# Patient Record
Sex: Male | Born: 1975 | Race: Black or African American | Hispanic: No | Marital: Single | Smoking: Never smoker
Health system: Southern US, Community
[De-identification: ages and names within clinical notes are randomized; demographics above are authoritative.]

---

## 2001-03-12 ENCOUNTER — Emergency Department (HOSPITAL_COMMUNITY): Admission: EM | Admit: 2001-03-12 | Discharge: 2001-03-13 | Payer: Self-pay | Admitting: Emergency Medicine

## 2004-11-28 ENCOUNTER — Ambulatory Visit: Payer: Self-pay | Admitting: Gastroenterology

## 2004-11-29 ENCOUNTER — Ambulatory Visit: Payer: Self-pay | Admitting: Gastroenterology

## 2004-11-29 ENCOUNTER — Encounter (INDEPENDENT_AMBULATORY_CARE_PROVIDER_SITE_OTHER): Payer: Self-pay | Admitting: *Deleted

## 2004-12-14 ENCOUNTER — Ambulatory Visit: Payer: Self-pay | Admitting: Gastroenterology

## 2005-01-04 ENCOUNTER — Ambulatory Visit: Payer: Self-pay | Admitting: Gastroenterology

## 2005-01-04 ENCOUNTER — Ambulatory Visit (HOSPITAL_COMMUNITY): Admission: RE | Admit: 2005-01-04 | Discharge: 2005-01-04 | Payer: Self-pay | Admitting: Gastroenterology

## 2006-01-18 ENCOUNTER — Ambulatory Visit: Payer: Self-pay | Admitting: Hematology and Oncology

## 2006-01-21 LAB — HEMOGLOBINOPATHY EVALUATION: Hgb A2 Quant: 2.6 % (ref 2.2–3.2)

## 2007-12-29 DIAGNOSIS — K644 Residual hemorrhoidal skin tags: Secondary | ICD-10-CM | POA: Insufficient documentation

## 2007-12-29 DIAGNOSIS — K2970 Gastritis, unspecified, without bleeding: Secondary | ICD-10-CM

## 2007-12-29 DIAGNOSIS — Z860100 Personal history of colon polyps, unspecified: Secondary | ICD-10-CM | POA: Insufficient documentation

## 2007-12-29 DIAGNOSIS — K449 Diaphragmatic hernia without obstruction or gangrene: Secondary | ICD-10-CM

## 2007-12-29 DIAGNOSIS — K2990 Gastroduodenitis, unspecified, without bleeding: Secondary | ICD-10-CM | POA: Insufficient documentation

## 2007-12-30 ENCOUNTER — Ambulatory Visit: Payer: Self-pay | Admitting: Gastroenterology

## 2007-12-30 DIAGNOSIS — K612 Anorectal abscess: Secondary | ICD-10-CM

## 2007-12-30 DIAGNOSIS — R634 Abnormal weight loss: Secondary | ICD-10-CM

## 2007-12-30 LAB — CONVERTED CEMR LAB
HCV Ab: NEGATIVE
Hepatitis B Surface Ag: NEGATIVE

## 2007-12-31 ENCOUNTER — Ambulatory Visit: Payer: Self-pay | Admitting: Gastroenterology

## 2007-12-31 ENCOUNTER — Telehealth: Payer: Self-pay | Admitting: Gastroenterology

## 2007-12-31 LAB — CONVERTED CEMR LAB
AST: 30 units/L (ref 0–37)
Albumin: 3.9 g/dL (ref 3.5–5.2)
BUN: 12 mg/dL (ref 6–23)
Basophils Absolute: 0 10*3/uL (ref 0.0–0.1)
Basophils Relative: 0.4 % (ref 0.0–1.0)
Calcium: 8.9 mg/dL (ref 8.4–10.5)
Creatinine, Ser: 0.9 mg/dL (ref 0.4–1.5)
Eosinophils Absolute: 0 10*3/uL (ref 0.0–0.7)
Eosinophils Relative: 0.9 % (ref 0.0–5.0)
GFR calc Af Amer: 124 mL/min
GFR calc non Af Amer: 103 mL/min
HCT: 46.6 % (ref 39.0–52.0)
MCHC: 34 g/dL (ref 30.0–36.0)
MCV: 100.8 fL — ABNORMAL HIGH (ref 78.0–100.0)
Monocytes Absolute: 0.5 10*3/uL (ref 0.1–1.0)
Neutro Abs: 3.1 10*3/uL (ref 1.4–7.7)
RBC: 4.62 M/uL (ref 4.22–5.81)
TSH: 0.97 microintl units/mL (ref 0.35–5.50)
WBC: 5 10*3/uL (ref 4.5–10.5)

## 2008-01-01 ENCOUNTER — Encounter: Payer: Self-pay | Admitting: Gastroenterology

## 2008-09-17 ENCOUNTER — Encounter: Admission: RE | Admit: 2008-09-17 | Discharge: 2008-09-17 | Payer: Self-pay | Admitting: Specialist

## 2009-08-16 ENCOUNTER — Emergency Department (HOSPITAL_COMMUNITY): Admission: EM | Admit: 2009-08-16 | Discharge: 2009-08-16 | Payer: Self-pay | Admitting: Emergency Medicine

## 2009-11-17 ENCOUNTER — Encounter (INDEPENDENT_AMBULATORY_CARE_PROVIDER_SITE_OTHER): Payer: Self-pay | Admitting: *Deleted

## 2010-08-01 NOTE — Letter (Signed)
Summary: Colonoscopy Date Change Letter  [facility]  [address] [place], [place] [address]   Phone: [phone]  Fax: [phone]      Nov 17, 2009 MRN: [id number]   James Joyce [address] [place], [place]  [address]   Dear [name],   Previously you were recommended to have a repeat colonoscopy around this time. Your chart was recently reviewed by Dr. Jarold Motto of [facility]. Follow up colonoscopy is now recommended in May 2016. This revised recommendation is based on current, nationally recognized guidelines for colorectal cancer screening and polyp surveillance. These guidelines are endorsed by the American Cancer Society, [employer] on Colorectal Cancer as well as numerous other major medical organizations.  Please understand that our recommendation assumes that you do not have any new symptoms such as bleeding, a change in bowel habits, anemia, or significant abdominal discomfort. If you do have any concerning GI symptoms or want to discuss the guideline recommendations, please call to arrange an office visit at your earliest convenience. Otherwise we will keep you in our reminder system and contact you 1-2 months prior to the date listed above to schedule your next colonoscopy.  Thank you,   Vania Rea. Jarold Motto, M.D.  [facility] [phone]

## 2010-11-17 NOTE — Op Note (Signed)
NAMEJASSIEL, James Joyce               ACCOUNT NO.:  [id number]   MEDICAL RECORD NO.:  [id number]          PATIENT TYPE:  AMB   LOCATION:  ENDO                         FACILITY:  [facility]   PHYSICIAN:  Vania Rea. Jarold Motto, M.D. [facility] OF BIRTH:  07-30-1975   DATE OF PROCEDURE:  01/04/2005  DATE OF DISCHARGE:  01/04/2005                                 OPERATIVE REPORT   PROCEDURE PERFORMED:  Esophageal manometry.   INDICATIONS FOR PROCEDURE:  This patient has chronic chest pain and  gastroesophageal reflux disease.  Manometry was undertaken to be sure that  he did not have an esophageal motility disorder.   RESULTS:  1.  Upper esophageal sphincter.  I really could not evaluate the upper      esophageal sphincter based on manometry data provided at this time.  2.  Lower esophageal sphincter.  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  There appeared to be adequate relaxation      on swallowing.  3.  Motility pattern.  There are normally propagated peristaltic waves      throughout the length of the esophagus.  Mean amplitude of esophageal      contractions 72 mmHg.   ASSESSMENT:  This manometry shows incompetent lower esophageal sphincter  with no evidence of esophageal motility disorder.   24-HOUR PH PROBE TEST:  24-hour pH probe test was completed on January 04, 2005.  Both proximal and distal passive pH probes were placed.  There was no  evidence of distal or proximal acid reflux on this exam with a DeMeester  score of 5.8. There was no reflux in the supine or upright positions.  There  also was no significant positivity to his symptom index.   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  This possibly reflects the patient being on proton  pump inhibitor therapy during the time of this procedure.        DRP/MEDQ  D:   01/11/2005  T:  01/11/2005  Job:  [id number]

## 2012-11-26 ENCOUNTER — Encounter (HOSPITAL_COMMUNITY): Payer: Self-pay | Admitting: Emergency Medicine

## 2012-11-26 DIAGNOSIS — K2990 Gastroduodenitis, unspecified, without bleeding: Secondary | ICD-10-CM | POA: Insufficient documentation

## 2012-11-26 DIAGNOSIS — K2970 Gastritis, unspecified, without bleeding: Secondary | ICD-10-CM | POA: Insufficient documentation

## 2012-11-26 DIAGNOSIS — Z79899 Other long term (current) drug therapy: Secondary | ICD-10-CM | POA: Insufficient documentation

## 2012-11-26 DIAGNOSIS — R112 Nausea with vomiting, unspecified: Secondary | ICD-10-CM | POA: Insufficient documentation

## 2012-11-26 NOTE — ED Notes (Signed)
PT. REPORTS NAUSEA AND VOMITTING WITH POOR APPETITE ONSET LAST WEEK , DENIES FEVER OR CHILLS , NO ABDOMINAL PAIN .

## 2012-11-27 ENCOUNTER — Emergency Department (HOSPITAL_COMMUNITY)
Admission: EM | Admit: 2012-11-27 | Discharge: 2012-11-27 | Disposition: A | Discharge status: Home / Self Care | Payer: Self-pay | Attending: Emergency Medicine | Admitting: Emergency Medicine

## 2012-11-27 DIAGNOSIS — R112 Nausea with vomiting, unspecified: Secondary | ICD-10-CM

## 2012-11-27 DIAGNOSIS — K2970 Gastritis, unspecified, without bleeding: Secondary | ICD-10-CM

## 2012-11-27 LAB — COMPREHENSIVE METABOLIC PANEL
ALT: 26 U/L (ref 0–53)
Alkaline Phosphatase: 60 U/L (ref 39–117)
Chloride: 96 mEq/L (ref 96–112)
GFR calc Af Amer: >90 mL/min (ref >90)
Glucose, Bld: 101 mg/dL — ABNORMAL HIGH (ref 70–99)
Potassium: 4 mEq/L (ref 3.5–5.1)
Sodium: 135 mEq/L (ref 135–145)
Total Protein: 7.3 g/dL (ref 6.0–8.3)

## 2012-11-27 LAB — CBC WITH DIFFERENTIAL/PLATELET
Basophils Absolute: 0.1 10*3/uL (ref 0.0–0.1)
Eosinophils Absolute: 0.1 10*3/uL (ref 0.0–0.7)
HCT: 40.4 % (ref 39.0–52.0)
Lymphocytes Relative: 32 % (ref 12–46)
Lymphs Abs: 1.7 10*3/uL (ref 0.7–4.0)
MCHC: 36.6 g/dL — ABNORMAL HIGH (ref 30.0–36.0)
MCV: 91.2 fL (ref 78.0–100.0)
Neutro Abs: 2.7 10*3/uL (ref 1.7–7.7)
RDW: 11.4 % — ABNORMAL LOW (ref 11.5–15.5)

## 2012-11-27 LAB — URINALYSIS, ROUTINE W REFLEX MICROSCOPIC
Bilirubin Urine: NEGATIVE
Hgb urine dipstick: NEGATIVE
Protein, ur: NEGATIVE mg/dL
Urobilinogen, UA: 0.2 mg/dL (ref 0.0–1.0)

## 2012-11-27 MED ORDER — ONDANSETRON 4 MG PO TBDP: 4.0000 mg | ORAL_TABLET | Freq: Three times a day (TID) | ORAL | Status: DC | PRN

## 2012-11-27 MED ORDER — SODIUM CHLORIDE 0.9 % IV BOLUS (SEPSIS)
1000.0000 mL | Freq: Once | INTRAVENOUS | Status: AC
  Administered 2012-11-27: 1000 mL via INTRAVENOUS

## 2012-11-27 MED ORDER — ONDANSETRON HCL 4 MG/2ML IJ SOLN
INTRAMUSCULAR | Status: AC
  Filled 2012-11-27: qty 2

## 2012-11-27 MED ORDER — PANTOPRAZOLE SODIUM 40 MG IV SOLR
40.0000 mg | Freq: Once | INTRAVENOUS | Status: AC
  Administered 2012-11-27: 40 mg via INTRAVENOUS
  Filled 2012-11-27: qty 40

## 2012-11-27 MED ORDER — ONDANSETRON HCL 4 MG/2ML IJ SOLN
4.0000 mg | Freq: Once | INTRAMUSCULAR | Status: AC
  Administered 2012-11-27: 4 mg via INTRAVENOUS

## 2012-11-27 MED ORDER — PANTOPRAZOLE SODIUM 20 MG PO TBEC: 40.0000 mg | DELAYED_RELEASE_TABLET | Freq: Every day | ORAL | Status: DC

## 2012-11-27 NOTE — ED Provider Notes (Signed)
History     CSN: [id number]  Arrival date & time 11/26/12  2346   First MD Initiated Contact with Patient 11/27/12 0138      Chief Complaint  Patient presents with  . Emesis    (Consider location/radiation/quality/duration/timing/severity/associated sxs/prior treatment) HPI 37 year old male presents to emergency room with complaint of nausea and vomiting, ongoing since Friday.  He has not been able to eat or drink well for last 2 days.  Patient has history of gastritis, and reports it has been sometime since he took his medication.  Patient reports he does drink alcohol on a regular basis, but does not think he had a binge episode before his most recent nausea and vomiting episode.  No prior history of pancreatitis.  No abdominal pain.  No fever or chills.  No sick contacts.  Patient is originally from [place], but [place] the country [place] the last 16 [place].  No diarrhea.  History reviewed. No pertinent past medical history.  History reviewed. No pertinent past surgical history.  No family history on file.  History  Substance Use Topics  . Smoking status: Never Smoker   . Smokeless tobacco: Not on file  . Alcohol Use: Yes      Review of Systems  See History of Present Illness; otherwise all other systems are reviewed and negative Allergies  Review of patient's allergies indicates no known allergies.  Home Medications   Current Outpatient Rx  Name  Route  Sig  Dispense  Refill  . OVER THE COUNTER MEDICATION   Oral   Take 1 tablet by mouth daily. otc acid reflux medication         . ondansetron (ZOFRAN ODT) 4 MG disintegrating tablet   Oral   Take 1 tablet (4 mg total) by mouth every 8 (eight) hours as needed for nausea.   20 tablet   0   . pantoprazole (PROTONIX) 20 MG tablet   Oral   Take 2 tablets (40 mg total) by mouth daily.   30 tablet   0     BP 118/72  Pulse 84  Temp(Src) 99.1 F (37.3 C) (Oral)  Resp 26  SpO2 100%  Physical Exam  Nursing  note and vitals reviewed. Constitutional: He is oriented to person, place, and time. He appears well-developed and well-nourished.  HENT:  Head: Normocephalic and atraumatic.  Right Ear: External ear normal.  Left Ear: External ear normal.  Nose: Nose normal.  Mouth/Throat: Oropharynx is clear and moist.  Eyes: Conjunctivae and EOM are normal. Pupils are equal, round, and reactive to light.  Neck: Normal range of motion. Neck supple. No JVD present. No tracheal deviation present. No thyromegaly present.  Cardiovascular: Normal rate, regular rhythm, normal heart sounds and intact distal pulses.  Exam reveals no gallop and no friction rub.   No murmur heard. Pulmonary/Chest: Effort normal and breath sounds normal. No stridor. No respiratory distress. He has no wheezes. He has no rales. He exhibits no tenderness.  Abdominal: Soft. Bowel sounds are normal. He exhibits no distension and no mass. There is no tenderness. There is no rebound and no guarding.  Musculoskeletal: Normal range of motion. He exhibits no edema and no tenderness.  Lymphadenopathy:    He has no cervical adenopathy.  Neurological: He is alert and oriented to person, place, and time. He exhibits normal muscle tone. Coordination normal.  Skin: Skin is warm and dry. No rash noted. No erythema. No pallor.  Psychiatric: He has a normal mood  and affect. His behavior is normal. Judgment and thought content normal.    ED Course  Procedures (including critical care time)  Labs Reviewed  COMPREHENSIVE METABOLIC PANEL - Abnormal; Notable for the following:    Glucose, Bld 101 (*)    AST 51 (*)    All other components within normal limits  CBC WITH DIFFERENTIAL - Abnormal; Notable for the following:    MCHC 36.6 (*)    RDW 11.4 (*)    Monocytes Relative 15 (*)    All other components within normal limits  URINALYSIS, ROUTINE W REFLEX MICROSCOPIC - Abnormal; Notable for the following:    Ketones, ur 40 (*)    All other  components within normal limits  LIPASE, BLOOD   No results found.   1. Nausea and vomiting   2. Gastritis       MDM  37 year old male with nausea and vomiting and probable return of his gastritis.  Labs with slightly elevated.  AST.  Ketones noted in urine.  Patient is feeling better after IV fluids.  No further vomiting.  Will discharge home with Protonix and Zofran.        Olivia Mackie, MD 11/27/12 [phone]

## 2013-10-20 ENCOUNTER — Emergency Department (HOSPITAL_COMMUNITY)
Admission: EM | Admit: 2013-10-20 | Discharge: 2013-10-20 | Disposition: A | Discharge status: Home / Self Care | Payer: No Typology Code available for payment source | Attending: Emergency Medicine | Admitting: Emergency Medicine

## 2013-10-20 ENCOUNTER — Encounter (HOSPITAL_COMMUNITY): Payer: Self-pay | Admitting: Emergency Medicine

## 2013-10-20 DIAGNOSIS — Y9389 Activity, other specified: Secondary | ICD-10-CM | POA: Insufficient documentation

## 2013-10-20 DIAGNOSIS — Y92410 Unspecified street and highway as the place of occurrence of the external cause: Secondary | ICD-10-CM | POA: Insufficient documentation

## 2013-10-20 DIAGNOSIS — Z79899 Other long term (current) drug therapy: Secondary | ICD-10-CM | POA: Insufficient documentation

## 2013-10-20 DIAGNOSIS — S336XXA Sprain of sacroiliac joint, initial encounter: Secondary | ICD-10-CM | POA: Insufficient documentation

## 2013-10-20 MED ORDER — CYCLOBENZAPRINE HCL 10 MG PO TABS: 10.0000 mg | ORAL_TABLET | Freq: Two times a day (BID) | ORAL | Status: DC | PRN

## 2013-10-20 MED ORDER — IBUPROFEN 800 MG PO TABS: 800.0000 mg | ORAL_TABLET | Freq: Three times a day (TID) | ORAL | Status: DC

## 2013-10-20 NOTE — Discharge Instructions (Signed)
Take ibuprofen for pain. Take Flexeril as needed for muscle spasm. Refer to attached documents for more information.
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
## 2013-10-21 NOTE — ED Provider Notes (Signed)
Medical screening examination/treatment/procedure(s) were performed by non-physician practitioner and as supervising physician I was immediately available for consultation/collaboration.   EKG Interpretation None        Trayquan Kolakowski N Rowyn Spilde, DO 10/21/13 0913 

## 2014-11-22 ENCOUNTER — Encounter: Payer: Self-pay | Admitting: Gastroenterology

## 2019-01-13 ENCOUNTER — Other Ambulatory Visit: Payer: Self-pay

## 2019-01-13 ENCOUNTER — Emergency Department (HOSPITAL_COMMUNITY): Payer: Self-pay

## 2019-01-13 ENCOUNTER — Inpatient Hospital Stay (HOSPITAL_COMMUNITY)
Admission: EM | Admit: 2019-01-13 | Discharge: 2019-01-15 | DRG: 897 | Disposition: A | Discharge status: Home Health | Payer: Self-pay | Attending: Internal Medicine | Admitting: Internal Medicine

## 2019-01-13 ENCOUNTER — Encounter (HOSPITAL_COMMUNITY): Payer: Self-pay | Admitting: Emergency Medicine

## 2019-01-13 DIAGNOSIS — M25561 Pain in right knee: Secondary | ICD-10-CM | POA: Diagnosis present

## 2019-01-13 DIAGNOSIS — R402362 Coma scale, best motor response, obeys commands, at arrival to emergency department: Secondary | ICD-10-CM | POA: Diagnosis present

## 2019-01-13 DIAGNOSIS — M25569 Pain in unspecified knee: Secondary | ICD-10-CM

## 2019-01-13 DIAGNOSIS — F10239 Alcohol dependence with withdrawal, unspecified: Principal | ICD-10-CM | POA: Diagnosis present

## 2019-01-13 DIAGNOSIS — R3129 Other microscopic hematuria: Secondary | ICD-10-CM | POA: Diagnosis present

## 2019-01-13 DIAGNOSIS — S60512A Abrasion of left hand, initial encounter: Secondary | ICD-10-CM | POA: Diagnosis present

## 2019-01-13 DIAGNOSIS — S025XXB Fracture of tooth (traumatic), initial encounter for open fracture: Secondary | ICD-10-CM

## 2019-01-13 DIAGNOSIS — F1020 Alcohol dependence, uncomplicated: Secondary | ICD-10-CM | POA: Diagnosis present

## 2019-01-13 DIAGNOSIS — K7010 Alcoholic hepatitis without ascites: Secondary | ICD-10-CM | POA: Diagnosis present

## 2019-01-13 DIAGNOSIS — E861 Hypovolemia: Secondary | ICD-10-CM | POA: Diagnosis present

## 2019-01-13 DIAGNOSIS — Y900 Blood alcohol level of less than 20 mg/100 ml: Secondary | ICD-10-CM | POA: Diagnosis present

## 2019-01-13 DIAGNOSIS — R569 Unspecified convulsions: Secondary | ICD-10-CM

## 2019-01-13 DIAGNOSIS — Y92481 Parking lot as the place of occurrence of the external cause: Secondary | ICD-10-CM

## 2019-01-13 DIAGNOSIS — M25551 Pain in right hip: Secondary | ICD-10-CM | POA: Diagnosis present

## 2019-01-13 DIAGNOSIS — G4089 Other seizures: Secondary | ICD-10-CM | POA: Diagnosis present

## 2019-01-13 DIAGNOSIS — S0081XA Abrasion of other part of head, initial encounter: Secondary | ICD-10-CM | POA: Diagnosis present

## 2019-01-13 DIAGNOSIS — R402142 Coma scale, eyes open, spontaneous, at arrival to emergency department: Secondary | ICD-10-CM | POA: Diagnosis present

## 2019-01-13 DIAGNOSIS — R9431 Abnormal electrocardiogram [ECG] [EKG]: Secondary | ICD-10-CM | POA: Diagnosis present

## 2019-01-13 DIAGNOSIS — E876 Hypokalemia: Secondary | ICD-10-CM | POA: Diagnosis present

## 2019-01-13 DIAGNOSIS — Z20828 Contact with and (suspected) exposure to other viral communicable diseases: Secondary | ICD-10-CM

## 2019-01-13 DIAGNOSIS — R402252 Coma scale, best verbal response, oriented, at arrival to emergency department: Secondary | ICD-10-CM | POA: Diagnosis present

## 2019-01-13 DIAGNOSIS — Z79899 Other long term (current) drug therapy: Secondary | ICD-10-CM

## 2019-01-13 DIAGNOSIS — R7401 Elevation of levels of liver transaminase levels: Secondary | ICD-10-CM | POA: Diagnosis present

## 2019-01-13 DIAGNOSIS — Z791 Long term (current) use of non-steroidal anti-inflammatories (NSAID): Secondary | ICD-10-CM

## 2019-01-13 DIAGNOSIS — F10939 Alcohol use, unspecified with withdrawal, unspecified: Secondary | ICD-10-CM | POA: Diagnosis present

## 2019-01-13 DIAGNOSIS — D696 Thrombocytopenia, unspecified: Secondary | ICD-10-CM | POA: Diagnosis present

## 2019-01-13 DIAGNOSIS — S025XXA Fracture of tooth (traumatic), initial encounter for closed fracture: Secondary | ICD-10-CM | POA: Diagnosis present

## 2019-01-13 DIAGNOSIS — W19XXXA Unspecified fall, initial encounter: Secondary | ICD-10-CM | POA: Diagnosis present

## 2019-01-13 DIAGNOSIS — E871 Hypo-osmolality and hyponatremia: Secondary | ICD-10-CM | POA: Diagnosis present

## 2019-01-13 DIAGNOSIS — Y939 Activity, unspecified: Secondary | ICD-10-CM

## 2019-01-13 LAB — URINALYSIS, ROUTINE W REFLEX MICROSCOPIC
Bilirubin Urine: NEGATIVE
Glucose, UA: NEGATIVE mg/dL
Ketones, ur: NEGATIVE mg/dL
Leukocytes,Ua: NEGATIVE
Nitrite: NEGATIVE
Protein, ur: NEGATIVE mg/dL
Specific Gravity, Urine: 1.003 — ABNORMAL LOW (ref 1.005–1.030)
pH: 6 (ref 5.0–8.0)

## 2019-01-13 LAB — CBC WITH DIFFERENTIAL/PLATELET
Abs Immature Granulocytes: 0.02 10*3/uL (ref 0.00–0.07)
Basophils Absolute: 0 10*3/uL (ref 0.0–0.1)
Basophils Relative: 1 %
Eosinophils Absolute: 0.1 10*3/uL (ref 0.0–0.5)
Eosinophils Relative: 1 %
HCT: 40.6 % (ref 39.0–52.0)
Hemoglobin: 14.1 g/dL (ref 13.0–17.0)
Immature Granulocytes: 1 %
Lymphocytes Relative: 19 %
Lymphs Abs: 0.9 10*3/uL (ref 0.7–4.0)
MCH: 34.7 pg — ABNORMAL HIGH (ref 26.0–34.0)
MCHC: 34.7 g/dL (ref 30.0–36.0)
MCV: 100 fL (ref 80.0–100.0)
Monocytes Absolute: 0.5 10*3/uL (ref 0.1–1.0)
Monocytes Relative: 11 %
Neutro Abs: 3 10*3/uL (ref 1.7–7.7)
Neutrophils Relative %: 67 %
Platelets: 85 10*3/uL — ABNORMAL LOW (ref 150–400)
RBC: 4.06 MIL/uL — ABNORMAL LOW (ref 4.22–5.81)
RDW: 12.6 % (ref 11.5–15.5)
WBC: 4.4 10*3/uL (ref 4.0–10.5)
nRBC: 0 % (ref 0.0–0.2)

## 2019-01-13 LAB — RAPID URINE DRUG SCREEN, HOSP PERFORMED
Amphetamines: NOT DETECTED
Barbiturates: NOT DETECTED
Benzodiazepines: POSITIVE — AB
Cocaine: NOT DETECTED
Opiates: NOT DETECTED
Tetrahydrocannabinol: NOT DETECTED

## 2019-01-13 LAB — COMPREHENSIVE METABOLIC PANEL
ALT: 570 U/L — ABNORMAL HIGH (ref 0–44)
AST: 1511 U/L — ABNORMAL HIGH (ref 15–41)
Albumin: 3.6 g/dL (ref 3.5–5.0)
Alkaline Phosphatase: 241 U/L — ABNORMAL HIGH (ref 38–126)
Anion gap: 16 — ABNORMAL HIGH (ref 5–15)
BUN: <5 mg/dL — ABNORMAL LOW (ref 6–20)
CO2: 22 mmol/L (ref 22–32)
Calcium: 9.5 mg/dL (ref 8.9–10.3)
Chloride: 93 mmol/L — ABNORMAL LOW (ref 98–111)
Creatinine, Ser: 0.9 mg/dL (ref 0.61–1.24)
GFR calc Af Amer: >60 mL/min (ref >60)
GFR calc non Af Amer: >60 mL/min (ref >60)
Glucose, Bld: 112 mg/dL — ABNORMAL HIGH (ref 70–99)
Potassium: 4 mmol/L (ref 3.5–5.1)
Sodium: 131 mmol/L — ABNORMAL LOW (ref 135–145)
Total Bilirubin: 0.6 mg/dL (ref 0.3–1.2)
Total Protein: 7.3 g/dL (ref 6.5–8.1)

## 2019-01-13 LAB — CBG MONITORING, ED: Glucose-Capillary: 113 mg/dL — ABNORMAL HIGH (ref 70–99)

## 2019-01-13 LAB — ETHANOL: Alcohol, Ethyl (B): <10 mg/dL (ref <10)

## 2019-01-13 MED ORDER — THIAMINE HCL 100 MG/ML IJ SOLN
100.0000 mg | Freq: Every day | INTRAMUSCULAR | Status: DC
  Administered 2019-01-13: 100 mg via INTRAVENOUS
  Filled 2019-01-13: qty 2

## 2019-01-13 MED ORDER — SODIUM CHLORIDE 0.9 % IV BOLUS
1000.0000 mL | Freq: Once | INTRAVENOUS | Status: AC
  Administered 2019-01-13: 1000 mL via INTRAVENOUS

## 2019-01-13 MED ORDER — LORAZEPAM 1 MG PO TABS: 0.0000 mg | ORAL_TABLET | Freq: Four times a day (QID) | ORAL | Status: DC

## 2019-01-13 MED ORDER — VITAMIN B-1 100 MG PO TABS: 100.0000 mg | ORAL_TABLET | Freq: Every day | ORAL | Status: DC

## 2019-01-13 MED ORDER — LORAZEPAM 2 MG/ML IJ SOLN: 0.0000 mg | Freq: Two times a day (BID) | INTRAMUSCULAR | Status: DC

## 2019-01-13 MED ORDER — PENICILLIN V POTASSIUM 500 MG PO TABS: 500.0000 mg | ORAL_TABLET | Freq: Four times a day (QID) | ORAL | 0 refills | Status: DC

## 2019-01-13 MED ORDER — LORAZEPAM 2 MG/ML IJ SOLN
0.0000 mg | Freq: Four times a day (QID) | INTRAMUSCULAR | Status: DC
  Administered 2019-01-13: 2 mg via INTRAVENOUS
  Filled 2019-01-13: qty 1

## 2019-01-13 MED ORDER — LORAZEPAM 1 MG PO TABS: 0.0000 mg | ORAL_TABLET | Freq: Two times a day (BID) | ORAL | Status: DC

## 2019-01-13 NOTE — ED Notes (Signed)
Please call patients cousin (only family in the [place]) Threasa Heads at [phone] to update her on patients condition.

## 2019-01-13 NOTE — ED Provider Notes (Signed)
[facility] Provider Note   CSN: [id number] Arrival date & time: 01/13/19  2029    History   Chief Complaint Chief Complaint  Patient presents with  . Seizures  . Alcohol Intoxication    HPI James Joyce is a 43 y.o. male.     43 year old male brought in by EMS for possible seizure.  Per EMS, patient was found lying in a parking lot with his body shaking.  Patient has abrasions to the right side of his face with a broken right upper central incisor as well as a small abrasion to his left hand.  Patient states [place], does not know what happened to him today, no prior history of seizures.  Patient admits to drinking on average 440 ounce beers daily, states he has had two today.  Patient reports feeling well, denies any injuries, complaints, concerns today.     History reviewed. No pertinent past medical history.  Patient Active Problem List   Diagnosis Date Noted  . ABSCESS OF ANAL AND RECTAL REGIONS 12/30/2007  . WEIGHT LOSS 12/30/2007  . EXTERNAL HEMORRHOIDS 12/29/2007  . GASTRITIS 12/29/2007  . HIATAL HERNIA 12/29/2007  . COLONIC POLYPS, HYPERPLASTIC, HX OF 12/29/2007    History reviewed. No pertinent surgical history.      Home Medications    Prior to Admission medications   Medication Sig Start Date End Date Taking? Authorizing Provider  cyclobenzaprine (FLEXERIL) 10 MG tablet Take 1 tablet (10 mg total) by mouth 2 (two) times daily as needed for muscle spasms. 10/20/13   Emilia BeckSzekalski, Kaitlyn, PA-C  ibuprofen (ADVIL,MOTRIN) 800 MG tablet Take 1 tablet (800 mg total) by mouth 3 (three) times daily. 10/20/13   Emilia BeckSzekalski, Kaitlyn, PA-C  Lansoprazole (PREVACID PO) Take 1 capsule by mouth daily.    [provider]  penicillin v potassium (VEETID) 500 MG tablet Take 1 tablet (500 mg total) by mouth 4 (four) times daily for 7 days. 01/13/19 01/20/19  Jeannie FendMurphy, Clarivel Callaway A, PA-C    Family History  History reviewed. No pertinent family history.  Social History Social History   Tobacco Use  . Smoking status: Never Smoker  . Smokeless tobacco: Never Used  Substance Use Topics  . Alcohol use: Yes    Comment: 64oz beers x4  . Drug use: No     Allergies   Patient has no known allergies.   Review of Systems Review of Systems  Unable to perform ROS: Mental status change  Respiratory: Negative for shortness of breath.   Cardiovascular: Negative for chest pain.  Gastrointestinal: Negative for abdominal pain, constipation, diarrhea, nausea and vomiting.  Musculoskeletal: Negative for arthralgias, back pain, joint swelling, myalgias, neck pain and neck stiffness.  Neurological: Negative for dizziness, weakness and headaches.     Physical Exam Updated Vital Signs BP 122/79   Pulse 85   Temp 98.7 F (37.1 C) (Oral)   Resp (!) 21   SpO2 100%   Physical Exam Vitals signs and nursing note reviewed.  Constitutional:      General: He is not in acute distress.    Appearance: He is well-developed. He is not diaphoretic.  HENT:     Head: Normocephalic.      Nose: Nose normal.     Mouth/Throat:     Mouth: Mucous membranes are moist. Injury present.   Cardiovascular:     Rate and Rhythm: Normal rate and regular rhythm.     Pulses: Normal  pulses.     Heart sounds: Normal heart sounds.  Pulmonary:     Effort: Pulmonary effort is normal.     Breath sounds: Normal breath sounds.  Abdominal:     Palpations: Abdomen is soft.     Tenderness: There is no abdominal tenderness.  Musculoskeletal:        General: No tenderness or signs of injury.     Cervical back: He exhibits no tenderness and no bony tenderness.     Thoracic back: He exhibits no tenderness and no bony tenderness.     Lumbar back: He exhibits no tenderness and no bony tenderness.     Comments: Moves all extremities without difficulty or sign of injury.   Skin:    General: Skin is warm and dry.     Findings:  No erythema or rash.  Neurological:     General: No focal deficit present.     Mental Status: He is alert.     GCS: GCS eye subscore is 4. GCS verbal subscore is 5. GCS motor subscore is 6.     Cranial Nerves: No cranial nerve deficit.     Sensory: No sensory deficit.     Motor: Tremor present. No weakness or pronator drift.     Comments: Alert to person, date, states currently in the [facility]  Tremor noted to upper and lower extremities, states this is from alcohol.  Psychiatric:        Behavior: Behavior normal.      ED Treatments / Results  Labs (all labs ordered are listed, but only abnormal results are displayed) Labs Reviewed  COMPREHENSIVE METABOLIC PANEL - Abnormal; Notable for the following components:      Result Value   Sodium 131 (*)    Chloride 93 (*)    Glucose, Bld 112 (*)    BUN <5 (*)    AST 1,511 (*)    ALT 570 (*)    Alkaline Phosphatase 241 (*)    Anion gap 16 (*)    All other components within normal limits  CBC WITH DIFFERENTIAL/PLATELET - Abnormal; Notable for the following components:   RBC 4.06 (*)    MCH 34.7 (*)    Platelets 85 (*)    All other components within normal limits  URINALYSIS, ROUTINE W REFLEX MICROSCOPIC - Abnormal; Notable for the following components:   Specific Gravity, Urine 1.003 (*)    Hgb urine dipstick SMALL (*)    Bacteria, UA RARE (*)    All other components within normal limits  RAPID URINE DRUG SCREEN, HOSP PERFORMED - Abnormal; Notable for the following components:   Benzodiazepines POSITIVE (*)    All other components within normal limits  CBG MONITORING, ED - Abnormal; Notable for the following components:   Glucose-Capillary 113 (*)    All other components within normal limits  ETHANOL  HEPATITIS PANEL, ACUTE    EKG EKG Interpretation  Date/Time:  Tuesday January 13 2019 20:42:35 EDT Ventricular Rate:  101 PR Interval:    QRS Duration: 102 QT Interval:  388 QTC Calculation: 503 R Axis:   -170 Text  Interpretation:  Sinus tachycardia Right axis deviation ST elev, probable normal early repol pattern Prolonged QT interval Abnormal ECG Confirmed by Carmin Muskrat [phone]) on 01/13/2019 9:24:51 PM   Radiology Ct Head Wo Contrast  Result Date: 01/13/2019 CLINICAL DATA:  Seizure, fall. EXAM: CT HEAD WITHOUT CONTRAST CT MAXILLOFACIAL WITHOUT CONTRAST CT CERVICAL SPINE WITHOUT CONTRAST TECHNIQUE: Multidetector CT imaging of the head,  cervical spine, and maxillofacial structures were performed using the standard protocol without intravenous contrast. Multiplanar CT image reconstructions of the cervical spine and maxillofacial structures were also generated. COMPARISON:  None. FINDINGS: CT HEAD FINDINGS Brain: No acute intracranial abnormality. Specifically, no hemorrhage, hydrocephalus, mass lesion, acute infarction, or significant intracranial injury. Vascular: No hyperdense vessel or unexpected calcification. Skull: No acute calvarial abnormality. Other: None CT MAXILLOFACIAL FINDINGS Osseous: No fracture or mandibular dislocation. No destructive process. Orbits: Negative. No traumatic or inflammatory finding. Sinuses: Clear Soft tissues: Soft tissue swelling over the right lateral orbit and face. CT CERVICAL SPINE FINDINGS Alignment: No subluxation Skull base and vertebrae: No acute fracture. No primary bone lesion or focal pathologic process. Soft tissues and spinal canal: No prevertebral fluid or swelling. No visible canal hematoma. Disc levels:  Maintained.  Degenerative facet disease bilaterally. Upper chest: No acute findings Other: None IMPRESSION: No acute intracranial abnormality. No facial or cervical spine fracture. Electronically Signed   By: Charlett NoseKevin  Dover M.D.   On: 01/13/2019 22:30   Ct Cervical Spine Wo Contrast  Result Date: 01/13/2019 CLINICAL DATA:  Seizure, fall. EXAM: CT HEAD WITHOUT CONTRAST CT MAXILLOFACIAL WITHOUT CONTRAST CT CERVICAL SPINE WITHOUT CONTRAST TECHNIQUE: Multidetector CT  imaging of the head, cervical spine, and maxillofacial structures were performed using the standard protocol without intravenous contrast. Multiplanar CT image reconstructions of the cervical spine and maxillofacial structures were also generated. COMPARISON:  None. FINDINGS: CT HEAD FINDINGS Brain: No acute intracranial abnormality. Specifically, no hemorrhage, hydrocephalus, mass lesion, acute infarction, or significant intracranial injury. Vascular: No hyperdense vessel or unexpected calcification. Skull: No acute calvarial abnormality. Other: None CT MAXILLOFACIAL FINDINGS Osseous: No fracture or mandibular dislocation. No destructive process. Orbits: Negative. No traumatic or inflammatory finding. Sinuses: Clear Soft tissues: Soft tissue swelling over the right lateral orbit and face. CT CERVICAL SPINE FINDINGS Alignment: No subluxation Skull base and vertebrae: No acute fracture. No primary bone lesion or focal pathologic process. Soft tissues and spinal canal: No prevertebral fluid or swelling. No visible canal hematoma. Disc levels:  Maintained.  Degenerative facet disease bilaterally. Upper chest: No acute findings Other: None IMPRESSION: No acute intracranial abnormality. No facial or cervical spine fracture. Electronically Signed   By: Charlett NoseKevin  Dover M.D.   On: 01/13/2019 22:30   Ct Maxillofacial Wo Cm  Result Date: 01/13/2019 CLINICAL DATA:  Seizure, fall. EXAM: CT HEAD WITHOUT CONTRAST CT MAXILLOFACIAL WITHOUT CONTRAST CT CERVICAL SPINE WITHOUT CONTRAST TECHNIQUE: Multidetector CT imaging of the head, cervical spine, and maxillofacial structures were performed using the standard protocol without intravenous contrast. Multiplanar CT image reconstructions of the cervical spine and maxillofacial structures were also generated. COMPARISON:  None. FINDINGS: CT HEAD FINDINGS Brain: No acute intracranial abnormality. Specifically, no hemorrhage, hydrocephalus, mass lesion, acute infarction, or significant  intracranial injury. Vascular: No hyperdense vessel or unexpected calcification. Skull: No acute calvarial abnormality. Other: None CT MAXILLOFACIAL FINDINGS Osseous: No fracture or mandibular dislocation. No destructive process. Orbits: Negative. No traumatic or inflammatory finding. Sinuses: Clear Soft tissues: Soft tissue swelling over the right lateral orbit and face. CT CERVICAL SPINE FINDINGS Alignment: No subluxation Skull base and vertebrae: No acute fracture. No primary bone lesion or focal pathologic process. Soft tissues and spinal canal: No prevertebral fluid or swelling. No visible canal hematoma. Disc levels:  Maintained.  Degenerative facet disease bilaterally. Upper chest: No acute findings Other: None IMPRESSION: No acute intracranial abnormality. No facial or cervical spine fracture. Electronically Signed   By: Charlett NoseKevin  Dover M.D.  On: 01/13/2019 22:30    Procedures Procedures (including critical care time)  Medications Ordered in ED Medications  LORazepam (ATIVAN) injection 0-4 mg (2 mg Intravenous Given 01/13/19 2116)    Or  LORazepam (ATIVAN) tablet 0-4 mg ( Oral See Alternative 01/13/19 2116)  LORazepam (ATIVAN) injection 0-4 mg (has no administration in time range)    Or  LORazepam (ATIVAN) tablet 0-4 mg (has no administration in time range)  thiamine (VITAMIN B-1) tablet 100 mg ( Oral See Alternative 01/13/19 2115)    Or  thiamine (B-1) injection 100 mg (100 mg Intravenous Given 01/13/19 2115)  sodium chloride 0.9 % bolus 1,000 mL (1,000 mLs Intravenous New Bag/Given 01/13/19 2141)     Initial Impression / Assessment and Plan / ED Course  I have reviewed the triage vital signs and the nursing notes.  Pertinent labs & imaging results that were available during my care of the patient were reviewed by me and considered in my medical decision making (see chart for details).  Clinical Course as of Jan 13 2352  Tue Jan 13, 2019  2338 43yo male brought in by EMS for possible  seizure. Patient was found on the ground shaking tonight. No history of prior seizures, reports drinking 4, 40oz beers daily, has had 2 40s today. States he came here today on a bus, alert to person and date, for location states [place]. Patient has a broken upper central incisor, minor abrasions to the right cheek and dorsum of left hand. Tremor noted to upper and lower extremities which he states is normal and related to alcohol.  CT head/c-spine/maxillofacial negative for traumatic injuries.  UDS positive for benzos, not on patient's med list, unsure if given by EMS. UA with small hemoglobin, CBC with low platelets at 85k, CMP with elevated liver enzymes most likely related to alcohol use. Acute hepatitis panel added for follow up, US RUQ to evaluate liver/gallbladder.    [LM]  2352 Care signed out pending US.    [LM]    Clinical Course User Index [LM] Jeannie FendMurphy, Paizlee Kinder A, PA-C      Final Clinical Impressions(s) / ED Diagnoses   Final diagnoses:  Seizure-like activity [facility](HCC)  Open fracture of tooth, initial encounter    ED Discharge Orders         Ordered    penicillin v potassium (VEETID) 500 MG tablet  4 times daily     01/13/19 2332           Jeannie FendMurphy, Minard Millirons A, PA-C 01/13/19 2353    Gerhard MunchLockwood, Robert, MD 01/19/19 1919

## 2019-01-13 NOTE — ED Triage Notes (Signed)
Patient from a parking and was laying on ground rolling around.  Patient did have some altered level of consciousness upon EMS arrival.  Lorn Junes thought he was having a seizure.  Phone call from cousin states that he is a heavy drinker.  Patient has been slightly uncooperative.  No complaints at this time, except had some left leg pain previously.  Patient may have chipped a front tooth.  Patient just returned from [facility] yesterday.

## 2019-01-13 NOTE — ED Notes (Signed)
Patient transported to CT 

## 2019-01-13 NOTE — Discharge Instructions (Addendum)
Take Penicillin as prescribed and complete the full course. Do not drive until cleared by Neurology. Follow up with primary care provider for elevated liver enzymes. Follow up with Dentist for your tooth.

## 2019-01-14 ENCOUNTER — Encounter (HOSPITAL_COMMUNITY): Payer: Self-pay | Admitting: Family Medicine

## 2019-01-14 ENCOUNTER — Emergency Department (HOSPITAL_COMMUNITY): Payer: Self-pay

## 2019-01-14 ENCOUNTER — Other Ambulatory Visit: Payer: Self-pay

## 2019-01-14 DIAGNOSIS — R569 Unspecified convulsions: Secondary | ICD-10-CM

## 2019-01-14 DIAGNOSIS — F10239 Alcohol dependence with withdrawal, unspecified: Secondary | ICD-10-CM | POA: Diagnosis present

## 2019-01-14 DIAGNOSIS — F10939 Alcohol use, unspecified with withdrawal, unspecified: Secondary | ICD-10-CM | POA: Diagnosis present

## 2019-01-14 DIAGNOSIS — E871 Hypo-osmolality and hyponatremia: Secondary | ICD-10-CM | POA: Diagnosis present

## 2019-01-14 DIAGNOSIS — R7401 Elevation of levels of liver transaminase levels: Secondary | ICD-10-CM | POA: Diagnosis present

## 2019-01-14 DIAGNOSIS — D696 Thrombocytopenia, unspecified: Secondary | ICD-10-CM | POA: Diagnosis present

## 2019-01-14 DIAGNOSIS — F10232 Alcohol dependence with withdrawal with perceptual disturbance: Secondary | ICD-10-CM

## 2019-01-14 DIAGNOSIS — F1020 Alcohol dependence, uncomplicated: Secondary | ICD-10-CM | POA: Diagnosis present

## 2019-01-14 DIAGNOSIS — R9431 Abnormal electrocardiogram [ECG] [EKG]: Secondary | ICD-10-CM | POA: Diagnosis present

## 2019-01-14 LAB — CBC WITH DIFFERENTIAL/PLATELET
Abs Immature Granulocytes: 0.02 10*3/uL (ref 0.00–0.07)
Basophils Absolute: 0 10*3/uL (ref 0.0–0.1)
Basophils Relative: 1 %
Eosinophils Absolute: 0.1 10*3/uL (ref 0.0–0.5)
Eosinophils Relative: 1 %
HCT: 37.3 % — ABNORMAL LOW (ref 39.0–52.0)
Hemoglobin: 12.9 g/dL — ABNORMAL LOW (ref 13.0–17.0)
Immature Granulocytes: 1 %
Lymphocytes Relative: 11 %
Lymphs Abs: 0.5 10*3/uL — ABNORMAL LOW (ref 0.7–4.0)
MCH: 34.3 pg — ABNORMAL HIGH (ref 26.0–34.0)
MCHC: 34.6 g/dL (ref 30.0–36.0)
MCV: 99.2 fL (ref 80.0–100.0)
Monocytes Absolute: 0.5 10*3/uL (ref 0.1–1.0)
Monocytes Relative: 13 %
Neutro Abs: 2.9 10*3/uL (ref 1.7–7.7)
Neutrophils Relative %: 73 %
Platelets: 80 10*3/uL — ABNORMAL LOW (ref 150–400)
RBC: 3.76 MIL/uL — ABNORMAL LOW (ref 4.22–5.81)
RDW: 12.5 % (ref 11.5–15.5)
WBC: 4 10*3/uL (ref 4.0–10.5)
nRBC: 0 % (ref 0.0–0.2)

## 2019-01-14 LAB — HIV ANTIBODY (ROUTINE TESTING W REFLEX): HIV Screen 4th Generation wRfx: NONREACTIVE

## 2019-01-14 LAB — COMPREHENSIVE METABOLIC PANEL
ALT: 392 U/L — ABNORMAL HIGH (ref 0–44)
AST: 759 U/L — ABNORMAL HIGH (ref 15–41)
Albumin: 3 g/dL — ABNORMAL LOW (ref 3.5–5.0)
Alkaline Phosphatase: 201 U/L — ABNORMAL HIGH (ref 38–126)
Anion gap: 10 (ref 5–15)
BUN: <5 mg/dL — ABNORMAL LOW (ref 6–20)
CO2: 25 mmol/L (ref 22–32)
Calcium: 8.8 mg/dL — ABNORMAL LOW (ref 8.9–10.3)
Chloride: 101 mmol/L (ref 98–111)
Creatinine, Ser: 0.68 mg/dL (ref 0.61–1.24)
GFR calc Af Amer: >60 mL/min (ref >60)
GFR calc non Af Amer: >60 mL/min (ref >60)
Glucose, Bld: 95 mg/dL (ref 70–99)
Potassium: 3.3 mmol/L — ABNORMAL LOW (ref 3.5–5.1)
Sodium: 136 mmol/L (ref 135–145)
Total Bilirubin: 1 mg/dL (ref 0.3–1.2)
Total Protein: 6.2 g/dL — ABNORMAL LOW (ref 6.5–8.1)

## 2019-01-14 LAB — PROTIME-INR
INR: 1 (ref 0.8–1.2)
Prothrombin Time: 13.3 seconds (ref 11.4–15.2)

## 2019-01-14 LAB — MAGNESIUM: Magnesium: 2.2 mg/dL (ref 1.7–2.4)

## 2019-01-14 LAB — SARS CORONAVIRUS 2 BY RT PCR (HOSPITAL ORDER, PERFORMED IN ~~LOC~~ HOSPITAL LAB): SARS Coronavirus 2: NEGATIVE

## 2019-01-14 LAB — AMMONIA: Ammonia: 58 umol/L — ABNORMAL HIGH (ref 9–35)

## 2019-01-14 MED ORDER — LORAZEPAM 2 MG/ML IJ SOLN
2.0000 mg | INTRAMUSCULAR | Status: DC | PRN
  Administered 2019-01-14: 2 mg via INTRAVENOUS
  Filled 2019-01-14 (×2): qty 1

## 2019-01-14 MED ORDER — ACETAMINOPHEN 325 MG PO TABS: 650.0000 mg | ORAL_TABLET | Freq: Four times a day (QID) | ORAL | Status: DC | PRN

## 2019-01-14 MED ORDER — PANTOPRAZOLE SODIUM 20 MG PO TBEC
20.0000 mg | DELAYED_RELEASE_TABLET | Freq: Every day | ORAL | Status: DC
  Administered 2019-01-14 – 2019-01-15 (×2): 20 mg via ORAL
  Filled 2019-01-14 (×2): qty 1

## 2019-01-14 MED ORDER — DICLOFENAC SODIUM 1 % TD GEL
2.0000 g | Freq: Four times a day (QID) | TRANSDERMAL | Status: DC
  Administered 2019-01-14 – 2019-01-15 (×2): 2 g via TOPICAL
  Filled 2019-01-14: qty 100

## 2019-01-14 MED ORDER — POTASSIUM CHLORIDE CRYS ER 20 MEQ PO TBCR
40.0000 meq | EXTENDED_RELEASE_TABLET | ORAL | Status: AC
  Administered 2019-01-14 (×2): 40 meq via ORAL
  Filled 2019-01-14 (×2): qty 2

## 2019-01-14 MED ORDER — ONDANSETRON HCL 4 MG/2ML IJ SOLN: 4.0000 mg | Freq: Four times a day (QID) | INTRAMUSCULAR | Status: DC | PRN

## 2019-01-14 MED ORDER — BENZOCAINE 10 % MT GEL
Freq: Four times a day (QID) | OROMUCOSAL | Status: DC | PRN
  Administered 2019-01-14: 21:00:00 via OROMUCOSAL
  Filled 2019-01-14: qty 9

## 2019-01-14 MED ORDER — CYCLOBENZAPRINE HCL 10 MG PO TABS
5.0000 mg | ORAL_TABLET | Freq: Once | ORAL | Status: AC
  Administered 2019-01-14: 21:00:00 5 mg via ORAL
  Filled 2019-01-14: qty 1

## 2019-01-14 MED ORDER — THIAMINE HCL 100 MG/ML IJ SOLN
Freq: Once | INTRAVENOUS | Status: AC
  Administered 2019-01-14: 03:00:00 via INTRAVENOUS
  Filled 2019-01-14: qty 1000

## 2019-01-14 MED ORDER — DEXTROSE IN LACTATED RINGERS 5 % IV SOLN
INTRAVENOUS | Status: DC
  Administered 2019-01-14 – 2019-01-15 (×2): via INTRAVENOUS

## 2019-01-14 MED ORDER — SODIUM CHLORIDE 0.9% FLUSH
3.0000 mL | Freq: Two times a day (BID) | INTRAVENOUS | Status: DC
  Administered 2019-01-14: 3 mL via INTRAVENOUS

## 2019-01-14 NOTE — Plan of Care (Signed)
  Problem: Safety: Goal: Ability to remain free from injury will improve Outcome: Progressing   Problem: Skin Integrity: Goal: Risk for impaired skin integrity will decrease Outcome: Progressing   

## 2019-01-14 NOTE — Plan of Care (Signed)

## 2019-01-14 NOTE — ED Notes (Signed)
ED TO INPATIENT HANDOFF REPORT  ED Nurse Name and Phone #: Ben [phone]  S Name/Age/Gender James Joyce 43 y.o. male Room/Bed: RESUSC/RESUSC  Code Status   Code Status: Full Code  Home/SNF/Other Home Patient oriented to: self, place, time and situation Is this baseline? Yes   Triage Complete: Triage complete  Chief Complaint Seizure/ETOH   Triage Note Patient from a parking and was laying on ground rolling around.  Patient did have some altered level of consciousness upon EMS arrival.  Bess HarvestBystanders thought he was having a seizure.  Phone call from cousin states that he is a heavy drinker.  Patient has been slightly uncooperative.  No complaints at this time, except had some left leg pain previously.  Patient may have chipped a front tooth.  Patient just [facility].     Allergies No Known Allergies  Level of Care/Admitting Diagnosis ED Disposition    None      B Medical/Surgery History History reviewed. No pertinent past medical history. History reviewed. No pertinent surgical history.   A IV Location/Drains/Wounds Patient Lines/Drains/Airways Status   Active Line/Drains/Airways    Name:   Placement date:   Placement time:   Site:   Days:   Peripheral IV 01/13/19 Left Forearm   01/13/19    2042    Forearm   1          Intake/Output Last 24 hours No intake or output data in the 24 hours ending 01/14/19 0142  Labs/Imaging Results for orders placed or performed during the hospital encounter of 01/13/19 (from the past 48 hour(s))  CBG monitoring, ED     Status: Abnormal   Collection Time: 01/13/19  8:39 PM  Result Value Ref Range   Glucose-Capillary 113 (H) 70 - 99 mg/dL  Comprehensive metabolic panel     Status: Abnormal   Collection Time: 01/13/19  8:51 PM  Result Value Ref Range   Sodium 131 (L) 135 - 145 mmol/L   Potassium 4.0 3.5 - 5.1 mmol/L   Chloride 93 (L) 98 - 111 mmol/L   CO2 22 22 - 32 mmol/L   Glucose, Bld 112 (H) 70 - 99 mg/dL    BUN <5 (L) 6 - 20 mg/dL   Creatinine, Ser 1.610.90 0.61 - 1.24 mg/dL   Calcium 9.5 8.9 - 09.610.3 mg/dL   Total Protein 7.3 6.5 - 8.1 g/dL   Albumin 3.6 3.5 - 5.0 g/dL   AST 0,4541,511 (H) 15 - 41 U/L   ALT 570 (H) 0 - 44 U/L   Alkaline Phosphatase 241 (H) 38 - 126 U/L   Total Bilirubin 0.6 0.3 - 1.2 mg/dL   GFR calc non Af Amer >60 >60 mL/min   GFR calc Af Amer >60 >60 mL/min   Anion gap 16 (H) 5 - 15    Comment: Performed at [facility], [address] [place], [place] [address]  CBC with Differential     Status: Abnormal   Collection Time: 01/13/19  8:51 PM  Result Value Ref Range   WBC 4.4 4.0 - 10.5 K/uL   RBC 4.06 (L) 4.22 - 5.81 MIL/uL   Hemoglobin 14.1 13.0 - 17.0 g/dL   HCT 91.440.6 78.239.0 - 95.652.0 %   MCV 100.0 80.0 - 100.0 fL   MCH 34.7 (H) 26.0 - 34.0 pg   MCHC 34.7 30.0 - 36.0 g/dL   RDW 21.312.6 08.611.5 - 57.815.5 %   Platelets 85 (L) 150 - 400 K/uL  Comment: REPEATED TO VERIFY PLATELET COUNT CONFIRMED BY SMEAR Immature Platelet Fraction may be clinically indicated, consider ordering this additional test [id number]    nRBC 0.0 0.0 - 0.2 %   Neutrophils Relative % 67 %   Neutro Abs 3.0 1.7 - 7.7 K/uL   Lymphocytes Relative 19 %   Lymphs Abs 0.9 0.7 - 4.0 K/uL   Monocytes Relative 11 %   Monocytes Absolute 0.5 0.1 - 1.0 K/uL   Eosinophils Relative 1 %   Eosinophils Absolute 0.1 0.0 - 0.5 K/uL   Basophils Relative 1 %   Basophils Absolute 0.0 0.0 - 0.1 K/uL   Immature Granulocytes 1 %   Abs Immature Granulocytes 0.02 0.00 - 0.07 K/uL    Comment: Performed at [facility], [address] [place], [place] [address]  Ethanol     Status: None   Collection Time: 01/13/19  8:53 PM  Result Value Ref Range   Alcohol, Ethyl (B) <10 <10 mg/dL    Comment: (NOTE) Lowest detectable limit for serum alcohol is 10 mg/dL. For medical purposes only. Performed at [facility], [address] [place], [place] [address]   Urinalysis, Routine w reflex microscopic     Status:  Abnormal   Collection Time: 01/13/19 10:21 PM  Result Value Ref Range   Color, Urine YELLOW YELLOW   APPearance CLEAR CLEAR   Specific Gravity, Urine 1.003 (L) 1.005 - 1.030   pH 6.0 5.0 - 8.0   Glucose, UA NEGATIVE NEGATIVE mg/dL   Hgb urine dipstick SMALL (A) NEGATIVE   Bilirubin Urine NEGATIVE NEGATIVE   Ketones, ur NEGATIVE NEGATIVE mg/dL   Protein, ur NEGATIVE NEGATIVE mg/dL   Nitrite NEGATIVE NEGATIVE   Leukocytes,Ua NEGATIVE NEGATIVE   RBC / HPF 0-5 0 - 5 RBC/hpf   WBC, UA 0-5 0 - 5 WBC/hpf   Bacteria, UA RARE (A) NONE SEEN   Squamous Epithelial / LPF 0-5 0 - 5    Comment: Performed at [facility], [address] [place], [place] [address]  Urine rapid drug screen (hosp performed)     Status: Abnormal   Collection Time: 01/13/19 10:21 PM  Result Value Ref Range   Opiates NONE DETECTED NONE DETECTED   Cocaine NONE DETECTED NONE DETECTED   Benzodiazepines POSITIVE (A) NONE DETECTED   Amphetamines NONE DETECTED NONE DETECTED   Tetrahydrocannabinol NONE DETECTED NONE DETECTED   Barbiturates NONE DETECTED NONE DETECTED    Comment: (NOTE) DRUG SCREEN FOR MEDICAL PURPOSES ONLY.  IF CONFIRMATION IS NEEDED FOR ANY PURPOSE, NOTIFY LAB WITHIN 5 DAYS. LOWEST DETECTABLE LIMITS FOR URINE DRUG SCREEN Drug Class                     Cutoff (ng/mL) Amphetamine and metabolites    1000 Barbiturate and metabolites    200 Benzodiazepine                 200 Tricyclics and metabolites     300 Opiates and metabolites        300 Cocaine and metabolites        300 THC                            50 Performed at [facility], [address] [place], [place] [address]    Ct Head Wo Contrast  Result Date: 01/13/2019 CLINICAL DATA:  Seizure, fall. EXAM: CT HEAD WITHOUT CONTRAST  CT MAXILLOFACIAL WITHOUT CONTRAST CT CERVICAL SPINE WITHOUT CONTRAST TECHNIQUE: Multidetector CT imaging of the head, cervical spine, and maxillofacial structures were performed using the standard  protocol without intravenous contrast. Multiplanar CT image reconstructions of the cervical spine and maxillofacial structures were also generated. COMPARISON:  None. FINDINGS: CT HEAD FINDINGS Brain: No acute intracranial abnormality. Specifically, no hemorrhage, hydrocephalus, mass lesion, acute infarction, or significant intracranial injury. Vascular: No hyperdense vessel or unexpected calcification. Skull: No acute calvarial abnormality. Other: None CT MAXILLOFACIAL FINDINGS Osseous: No fracture or mandibular dislocation. No destructive process. Orbits: Negative. No traumatic or inflammatory finding. Sinuses: Clear Soft tissues: Soft tissue swelling over the right lateral orbit and face. CT CERVICAL SPINE FINDINGS Alignment: No subluxation Skull base and vertebrae: No acute fracture. No primary bone lesion or focal pathologic process. Soft tissues and spinal canal: No prevertebral fluid or swelling. No visible canal hematoma. Disc levels:  Maintained.  Degenerative facet disease bilaterally. Upper chest: No acute findings Other: None IMPRESSION: No acute intracranial abnormality. No facial or cervical spine fracture. Electronically Signed   By: Charlett NoseKevin  Dover M.D.   On: 01/13/2019 22:30   Ct Cervical Spine Wo Contrast  Result Date: 01/13/2019 CLINICAL DATA:  Seizure, fall. EXAM: CT HEAD WITHOUT CONTRAST CT MAXILLOFACIAL WITHOUT CONTRAST CT CERVICAL SPINE WITHOUT CONTRAST TECHNIQUE: Multidetector CT imaging of the head, cervical spine, and maxillofacial structures were performed using the standard protocol without intravenous contrast. Multiplanar CT image reconstructions of the cervical spine and maxillofacial structures were also generated. COMPARISON:  None. FINDINGS: CT HEAD FINDINGS Brain: No acute intracranial abnormality. Specifically, no hemorrhage, hydrocephalus, mass lesion, acute infarction, or significant intracranial injury. Vascular: No hyperdense vessel or unexpected calcification. Skull: No  acute calvarial abnormality. Other: None CT MAXILLOFACIAL FINDINGS Osseous: No fracture or mandibular dislocation. No destructive process. Orbits: Negative. No traumatic or inflammatory finding. Sinuses: Clear Soft tissues: Soft tissue swelling over the right lateral orbit and face. CT CERVICAL SPINE FINDINGS Alignment: No subluxation Skull base and vertebrae: No acute fracture. No primary bone lesion or focal pathologic process. Soft tissues and spinal canal: No prevertebral fluid or swelling. No visible canal hematoma. Disc levels:  Maintained.  Degenerative facet disease bilaterally. Upper chest: No acute findings Other: None IMPRESSION: No acute intracranial abnormality. No facial or cervical spine fracture. Electronically Signed   By: Charlett NoseKevin  Dover M.D.   On: 01/13/2019 22:30   [place] Abdomen Limited  Result Date: 01/14/2019 CLINICAL DATA:  43 year old male with abnormal liver enzymes. Seizure, fall. EXAM: ULTRASOUND ABDOMEN LIMITED RIGHT UPPER QUADRANT COMPARISON:  None. FINDINGS: Gallbladder: No gallstones or wall thickening visualized. No sonographic Murphy sign noted by sonographer. Common bile duct: Diameter: 4 millimeters, normal. Liver: Echogenic liver (image 9). No discrete liver lesion. No intrahepatic biliary ductal dilatation. Portal vein is patent on color Doppler imaging with normal direction of blood flow towards the liver. Other findings: Negative visible right kidney.  No free fluid. IMPRESSION: 1. Hepatic steatosis. 2. Otherwise normal right upper quadrant ultrasound. Electronically Signed   By: Odessa FlemingH  Hall M.D.   On: 01/14/2019 01:17   Ct Maxillofacial Wo Cm  Result Date: 01/13/2019 CLINICAL DATA:  Seizure, fall. EXAM: CT HEAD WITHOUT CONTRAST CT MAXILLOFACIAL WITHOUT CONTRAST CT CERVICAL SPINE WITHOUT CONTRAST TECHNIQUE: Multidetector CT imaging of the head, cervical spine, and maxillofacial structures were performed using the standard protocol without intravenous contrast. Multiplanar CT  image reconstructions of the cervical spine and maxillofacial structures were also generated. COMPARISON:  None. FINDINGS: CT HEAD FINDINGS Brain: No acute  intracranial abnormality. Specifically, no hemorrhage, hydrocephalus, mass lesion, acute infarction, or significant intracranial injury. Vascular: No hyperdense vessel or unexpected calcification. Skull: No acute calvarial abnormality. Other: None CT MAXILLOFACIAL FINDINGS Osseous: No fracture or mandibular dislocation. No destructive process. Orbits: Negative. No traumatic or inflammatory finding. Sinuses: Clear Soft tissues: Soft tissue swelling over the right lateral orbit and face. CT CERVICAL SPINE FINDINGS Alignment: No subluxation Skull base and vertebrae: No acute fracture. No primary bone lesion or focal pathologic process. Soft tissues and spinal canal: No prevertebral fluid or swelling. No visible canal hematoma. Disc levels:  Maintained.  Degenerative facet disease bilaterally. Upper chest: No acute findings Other: None IMPRESSION: No acute intracranial abnormality. No facial or cervical spine fracture. Electronically Signed   By: Rolm Baptise M.D.   On: 01/13/2019 22:30    Pending Labs Unresulted Labs (From admission, onward)    Start     Ordered   01/14/19 0059  SARS Coronavirus 2 (CEPHEID - Performed in [facility] hospital lab), Hosp Order  (Asymptomatic Patients Labs)  Once,   STAT    Question:  Rule Out  Answer:  Yes   01/14/19 0059   01/13/19 2312  Hepatitis panel, acute  ONCE - STAT,   STAT     01/13/19 2311          Vitals/Pain Today's Vitals   01/13/19 2100 01/13/19 2119 01/13/19 2130 01/13/19 2245  BP: 124/79  116/83 122/79  Pulse: 92  93 85  Resp: 18  (!) 21 (!) 21  Temp:      TempSrc:      SpO2: 100%  98% 100%  PainSc:  5       Isolation Precautions No active isolations  Medications Medications  LORazepam (ATIVAN) injection 0-4 mg (2 mg Intravenous Given 01/13/19 2116)    Or  LORazepam (ATIVAN) tablet 0-4  mg ( Oral See Alternative 01/13/19 2116)  LORazepam (ATIVAN) injection 0-4 mg (has no administration in time range)    Or  LORazepam (ATIVAN) tablet 0-4 mg (has no administration in time range)  thiamine (VITAMIN B-1) tablet 100 mg ( Oral See Alternative 01/13/19 2115)    Or  thiamine (B-1) injection 100 mg (100 mg Intravenous Given 01/13/19 2115)  sodium chloride 0.9 % bolus 1,000 mL (1,000 mLs Intravenous New Bag/Given 01/13/19 2141)    Mobility walks High fall risk   Focused Assessments Liver Function Enzymes Elevated    R Recommendations: See Admitting Provider Note  Report given to:   Additional Notes: N/A

## 2019-01-14 NOTE — ED Provider Notes (Signed)
Patient with seizure like activity.  Normally drinks (4) 40 ounce drinks of alcohol daily.  Alcohol level is negative today.  Given his seizure-like activity and confusion, there is concern for alcohol withdrawal seizure.  Additionally, patient has a transaminitis.  Right upper quadrant ultrasound negative.  Hepatitis panel pending.    Patient initially confused, thinking that he was in the [place], but he is not confused on my exam.  Appreciate Dr. Myna Hidalgo for admitting the patient.   Montine Circle, PA-C 01/14/19 0149    Palumbo, April, MD 01/14/19 6222

## 2019-01-14 NOTE — Progress Notes (Signed)
Updated pt's cousin on patients plan of care via phone with patients permission.  Clyde Canterbury, RN

## 2019-01-14 NOTE — Evaluation (Signed)
Physical Therapy Evaluation Patient Details Name: James Joyce MRN: [id number] DOB: Nov 09, 1975 Today's Date: 01/14/2019   History of Present Illness  43 y/o male admitted s/p seizure in a parking lot. pt with h/o alcoholism.  CT head and neck negative for acute changes.  Clinical Impression  Pt presents with impairments in strength, gait, balance and mobility and will benefit from skilled PT to address deficits and improve functional mobility    Follow Up Recommendations Home health PT;Supervision/Assistance - 24 hour    Equipment Recommendations  Other (comment)(TBD RW vs SPC)    Recommendations for Other Services OT consult     Precautions / Restrictions Precautions Precautions: Fall Restrictions Weight Bearing Restrictions: No      Mobility  Bed Mobility Overal bed mobility: Modified Independent             General bed mobility comments: increased time  Transfers Overall transfer level: Needs assistance Equipment used: None Transfers: Sit to/from [facility] Sit to Stand: Min assist Stand pivot transfers: Min assist       General transfer comment: assist needed due to balance deficits  Ambulation/Gait Ambulation/Gait assistance: Min assist Gait Distance (Feet): 25 Feet Assistive device: None       General Gait Details: pt with mild LOB with turning, staggering gait, wide BOS  Stairs            Wheelchair Mobility    Modified Rankin (Stroke Patients Only)       Balance Overall balance assessment: Needs assistance           Standing balance-Leahy Scale: Poor Standing balance comment: min A for standing balance without AD                             Pertinent Vitals/Pain Pain Assessment: Faces Faces Pain Scale: Hurts little more Pain Location: Rt LE from hip to toes Pain Descriptors / Indicators: Sore Pain Intervention(s): Limited activity within patient's tolerance;Monitored during session    Home  Living Family/patient expects to be discharged to:: Other (Comment)(hotel)                 Additional Comments: pt here visiting his daughter.  pt lives in [place].  Pt states he had been staying at a hotel    Prior Function Level of Independence: Independent               Hand Dominance        Extremity/Trunk Assessment   Upper Extremity Assessment Upper Extremity Assessment: Generalized weakness    Lower Extremity Assessment Lower Extremity Assessment: Generalized weakness;RLE deficits/detail RLE: Unable to fully assess due to pain    Cervical / Trunk Assessment Cervical / Trunk Assessment: Normal  Communication   Communication: No difficulties  Cognition Arousal/Alertness: Awake/alert Behavior During Therapy: WFL for tasks assessed/performed Overall Cognitive Status: Within Functional Limits for tasks assessed                                        General Comments      Exercises     Assessment/Plan    PT Assessment Patient needs continued PT services  PT Problem List Decreased activity tolerance;Decreased balance;Decreased strength;Decreased mobility;Cardiopulmonary status limiting activity       PT Treatment Interventions DME instruction;Therapeutic exercise;Gait training;Balance training;Wheelchair mobility training;Stair training;Neuromuscular re-education;Functional mobility training;Therapeutic activities;Patient/family education  PT Goals (Current goals can be found in the Care Plan section)  Acute Rehab PT Goals Patient Stated Goal: none stated PT Goal Formulation: With patient Time For Goal Achievement: 01/28/19 Potential to Achieve Goals: Fair    Frequency Min 3X/week   Barriers to discharge Inaccessible home environment;Decreased caregiver support      Co-evaluation               AM-PAC PT "6 Clicks" Mobility  Outcome Measure Help needed turning from your back to your side while in a flat bed without  using bedrails?: None Help needed moving from lying on your back to sitting on the side of a flat bed without using bedrails?: None Help needed moving to and from a bed to a chair (including a wheelchair)?: A Little Help needed standing up from a chair using your arms (e.g., wheelchair or bedside chair)?: A Little Help needed to walk in hospital room?: A Little Help needed climbing 3-5 steps with a railing? : A Lot 6 Click Score: 19    End of Session Equipment Utilized During Treatment: Gait belt Activity Tolerance: Patient tolerated treatment well Patient left: in bed;with call bell/phone within reach;with bed alarm set Nurse Communication: Mobility status PT Visit Diagnosis: Unsteadiness on feet (R26.81);Other abnormalities of gait and mobility (R26.89);Repeated falls (R29.6);Muscle weakness (generalized) (M62.81)    Time: [id number] PT Time Calculation (min) (ACUTE ONLY): 13 min   Charges:   PT Evaluation $PT Eval Moderate Complexity: 1 Mod          Reggy EyeKaren Brittiney Dicostanzo, PT, DPT  [name] 01/14/2019, 2:41 PM

## 2019-01-14 NOTE — Progress Notes (Addendum)
PROGRESS NOTE    James Joyce  PYK:[id number] DOB: [birth year] DOA: 01/13/2019 PCP: Patient, No Pcp Per    Brief Narrative:  43 year old male who presented with seizure activity.  He does have significant past medical history for alcoholism, he was witnessed to have a seizure activity in a parking.  When EMS arrived he was postictal.  He drinks more than 12 standard alcoholic beverage daily.  On his initial physical examination blood pressure 154/89, heart rate 105, respiratory 27, oxygen saturation 98%.  He was awake and alert, tremulous, nonfocal, moist mucous membranes, lungs clear to auscultation bilaterally, heart S1-S2 present and rhythmic, abdomen soft nontender, no lower extremity edema.  Superficial abrasions on the right face and hands.  Sodium 131, potassium 4.0, chloride 93, bicarb 22, glucose 112, BUN less than 5, creatinine 0.9, AST 1511, ALT 570, white count 4.4, hemoglobin 14.1, hematocrit 40.6, platelets 85.  SARS COVID-19 was negative, urinalysis negative for infection.  Alcohol less than 10.  Head and cervical CT scan negative for acute   changes.  EKG, 101 bpm, right axis deviation, normal intervals, (manually corrected QTC 415), no ST segment or T wave changes, positive LVH.  Patient was admitted to the hospital with working diagnosis of acute encephalopathy likely due to seizure activity.   Assessment & Plan:   Principal Problem:   Alcohol withdrawal (HCC) Active Problems:   Seizure-like activity (HCC)   Thrombocytopenia (HCC)   Hyponatremia   Transaminasemia   Alcoholism (HCC)   Prolonged QT interval    1. Acute alcohol withdrawal complicated with seizures. Patient continue to be somnolent, no po intake this am due to mouth pain. Positive tremors. Had lorazepam 2 mg IV at 3:38 am. This am continue to have tremors and somnolence. No seizures in the past. Will continue alcohol withdrawal protocol with lorazepam, add IV fluids with dextrose and LR at 75 ml, continue  seizure precautions and neuro checks per unit protocol. Physical therapy evaluation.  Transfer to medical ward.   2. Acute alcoholic hepatitis.  AST is down to 759 from 1,511, ALT down to 392 from 570, total bilirubins at 1.0. INR at 1,0. Will continue supportive medical care, IV fluids and as needed analgesics. Follow LFT in am.   3. Hyponatremia and hypokalemia. Serum Na at 136 this am with K at 3,3 and serum cr at 0,68. Will add Kcl for persistent hypokalemia, 80 meq in 2 divided doses. Follow on renal panel in am, resume hydration with balanced electrolyte solutions.   4. Prolonged Qtc. Manually corrected Qtc is 415. Will dc telemetry for now.    DVT prophylaxis: enoxaparin   Code Status: full Family Communication: no family at the bedside.  Disposition Plan/ discharge barriers:  Patient continue to have alcohol withdrawal symptoms, continue to require IV lorazepam, high risk for worsening withdrawal and recurrent seizures, will change to inpatient status.   Body mass index is 15.77 kg/m. Malnutrition Type:      Malnutrition Characteristics:      Nutrition Interventions:     RN Pressure Injury Documentation:     Consultants:     Procedures:     Antimicrobials:       Subjective: Patient has been somnolent this am, he received lorazepam third shift yesterday. This am no po intake due to mouth pain, no dyspnea or chest pain. At home having withdrawal symptoms. No seizures in the past.   Objective: Vitals:   01/14/19 0300 01/14/19 0317 01/14/19 0326 01/14/19 0839  BP: 122/80  116/79 124/78  Pulse: 86  78 95  Resp: 17  17 (!) 25  Temp:   98.5 F (36.9 C) 98.3 F (36.8 C)  TempSrc:   Oral Oral  SpO2: 99%  98% 100%  Weight:  51.3 kg    Height:  5\' 11"  (1.803 m)      Intake/Output Summary (Last 24 hours) at 01/14/2019 1210 Last data filed at 01/14/2019 0900 Gross per 24 hour  Intake 285.21 ml  Output -  Net 285.21 ml   Filed Weights   01/14/19 0317   Weight: 51.3 kg    Examination:   General: deconditioned and ill looking appearing  Neurology: patient is somnolent but easy to arouse, follows commands, he is orientated x3. Positive tremors.  E ENT: mild pallor, no icterus, oral mucosa moist/ dental injury upper teeth in the right.  Cardiovascular: No JVD. S1-S2 present, rhythmic, no gallops, rubs, or murmurs. No lower extremity edema. Pulmonary: positive breath sounds bilaterally, adequate air movement, no wheezing, rhonchi or rales. Gastrointestinal. Abdomen with no organomegaly, non tender, no rebound or guarding Skin. No rashes Musculoskeletal: no joint deformities/ positive edema on the right face.      Data Reviewed: I have personally reviewed following labs and imaging studies  CBC: Recent Labs  Lab 01/13/19 2051 01/14/19 0435  WBC 4.4 4.0  NEUTROABS 3.0 2.9  HGB 14.1 12.9*  HCT 40.6 37.3*  MCV 100.0 99.2  PLT 85* 80*   Basic Metabolic Panel: Recent Labs  Lab 01/13/19 2051 01/14/19 0435  NA 131* 136  K 4.0 3.3*  CL 93* 101  CO2 22 25  GLUCOSE 112* 95  BUN <5* <5*  CREATININE 0.90 0.68  CALCIUM 9.5 8.8*  MG  --  2.2   GFR: Estimated Creatinine Clearance: 84.6 mL/min (by C-G formula based on SCr of 0.68 mg/dL). Liver Function Tests: Recent Labs  Lab 01/13/19 2051 01/14/19 0435  AST 1,511* 759*  ALT 570* 392*  ALKPHOS 241* 201*  BILITOT 0.6 1.0  PROT 7.3 6.2*  ALBUMIN 3.6 3.0*   No results for input(s): LIPASE, AMYLASE in the last 168 hours. Recent Labs  Lab 01/14/19 0435  AMMONIA 58*   Coagulation Profile: Recent Labs  Lab 01/14/19 0435  INR 1.0   Cardiac Enzymes: No results for input(s): CKTOTAL, CKMB, CKMBINDEX, TROPONINI in the last 168 hours. BNP (last 3 results) No results for input(s): PROBNP in the last 8760 hours. HbA1C: No results for input(s): HGBA1C in the last 72 hours. CBG: Recent Labs  Lab 01/13/19 2039  GLUCAP 113*   Lipid Profile: No results for input(s):  CHOL, HDL, LDLCALC, TRIG, CHOLHDL, LDLDIRECT in the last 72 hours. Thyroid Function Tests: No results for input(s): TSH, T4TOTAL, FREET4, T3FREE, THYROIDAB in the last 72 hours. Anemia Panel: No results for input(s): VITAMINB12, FOLATE, FERRITIN, TIBC, IRON, RETICCTPCT in the last 72 hours.    Radiology Studies: I have reviewed all of the imaging during this hospital visit personally     Scheduled Meds: . pantoprazole  20 mg Oral Daily  . sodium chloride flush  3 mL Intravenous Q12H   Continuous Infusions:   LOS: 0 days        Mauricio Annett Gulaaniel Arrien, MD

## 2019-01-14 NOTE — H&P (Signed)
History and Physical    James Joyce ZDG:[id number] DOB: 1975-07-11 DOA: 01/13/2019  PCP: Patient, No Pcp Per   Patient coming from: Home   Chief Complaint: on ground shaking, confused   HPI: James Joyce is a 43 y.o. male with medical history significant for alcoholism, now presenting to the emergency department after he was noted by bystanders to be on the ground in a parking lot shaking.  EMS was called and the patient was found to be confused and brought into the hospital.  He does not remember these events, denies history of seizure disorder, acknowledges drinking more than 12 standard alcoholic drinks daily.  Patient was oriented to person only initially in the ED, but has been improving and now recognizes that he is in the hospital, states that he traveled from [place] on a bus yesterday, reports that he had been in his usual state with no recent fevers, chills, headache, chest pain, palpitations, or focal numbness or weakness.  He usually consumes four 40-oz beers every day.  He has some pain in the right hip when he lays on that side or ranges the joint, but has been able to bear weight since then and denies any pain while at rest.  ED Course: Upon arrival to the ED, patient is found to be afebrile, saturating well on room air, slightly tachycardic, and with stable blood pressure.  EKG features a sinus tachycardia with rate 101, RAD, and QTc of 103 ms.  Noncontrast head CT is negative for acute intracranial abnormality and CT cervical spine and maxillofacial are negative for acute fractures.  Right upper quadrant ultrasound is consistent with hepatic steatosis but otherwise normal.  Chemistry panel is notable for sodium 131, AST 1511, and ALT 570.  CBC features a platelet count of 85,000 and an MCV of 100.  UDS positive for benzodiazepines.  Urinalysis with microscopic hematuria and low specific gravity.  Patient was given a liter of normal saline and Ativan in the ED, and hospitalists  consulted for admission.  Review of Systems:  All other systems reviewed and apart from HPI, are negative.  History reviewed. No pertinent past medical history.  History reviewed. No pertinent surgical history.   reports that he has never smoked. He has never used smokeless tobacco. He reports current alcohol use. He reports that he does not use drugs.  No Known Allergies  History reviewed. No pertinent family history.   Prior to Admission medications   Medication Sig Start Date End Date Taking? Authorizing Provider  cyclobenzaprine (FLEXERIL) 10 MG tablet Take 1 tablet (10 mg total) by mouth 2 (two) times daily as needed for muscle spasms. 10/20/13   Alvina Chou, PA-C  ibuprofen (ADVIL,MOTRIN) 800 MG tablet Take 1 tablet (800 mg total) by mouth 3 (three) times daily. 10/20/13   Alvina Chou, PA-C  Lansoprazole (PREVACID PO) Take 1 capsule by mouth daily.    [provider]  penicillin v potassium (VEETID) 500 MG tablet Take 1 tablet (500 mg total) by mouth 4 (four) times daily for 7 days. 01/13/19 01/20/19  Tacy Learn, PA-C    Physical Exam: Vitals:   01/13/19 2045 01/13/19 2100 01/13/19 2130 01/13/19 2245  BP: (!) 154/89 124/79 116/83 122/79  Pulse: (!) 105 92 93 85  Resp: (!) 27 18 (!) 21 (!) 21  Temp:      TempSrc:      SpO2: 100% 100% 98% 100%    Constitutional: NAD, tremulous  Eyes: PERTLA, lids and  conjunctivae normal ENMT: Mucous membranes are moist. Posterior pharynx clear of any exudate or lesions.   Neck: normal, supple, no masses, no thyromegaly Respiratory:  no wheezing, no crackles. Normal respiratory effort. No accessory muscle use.  Cardiovascular: S1 & S2 heard, regular rate and rhythm. No extremity edema.   Abdomen: No distension, no tenderness, soft. Bowel sounds active.  Musculoskeletal: no clubbing / cyanosis. No joint deformity upper and lower extremities.    Skin: superficial abrasions to right face and hands. Warm, dry,  well-perfused. Neurologic: CN 2-12 grossly intact. Sensation intact. Strength 5/5 in all 4 limbs.  Psychiatric: Alert and oriented to person, place, and situation. Calm, cooperative.    Labs on Admission: I have personally reviewed following labs and imaging studies  CBC: Recent Labs  Lab 01/13/19 2051  WBC 4.4  NEUTROABS 3.0  HGB 14.1  HCT 40.6  MCV 100.0  PLT 85*   Basic Metabolic Panel: Recent Labs  Lab 01/13/19 2051  NA 131*  K 4.0  CL 93*  CO2 22  GLUCOSE 112*  BUN <5*  CREATININE 0.90  CALCIUM 9.5   GFR: CrCl cannot be calculated (Unknown ideal weight.). Liver Function Tests: Recent Labs  Lab 01/13/19 2051  AST 1,511*  ALT 570*  ALKPHOS 241*  BILITOT 0.6  PROT 7.3  ALBUMIN 3.6   No results for input(s): LIPASE, AMYLASE in the last 168 hours. No results for input(s): AMMONIA in the last 168 hours. Coagulation Profile: No results for input(s): INR, PROTIME in the last 168 hours. Cardiac Enzymes: No results for input(s): CKTOTAL, CKMB, CKMBINDEX, TROPONINI in the last 168 hours. BNP (last 3 results) No results for input(s): PROBNP in the last 8760 hours. HbA1C: No results for input(s): HGBA1C in the last 72 hours. CBG: Recent Labs  Lab 01/13/19 2039  GLUCAP 113*   Lipid Profile: No results for input(s): CHOL, HDL, LDLCALC, TRIG, CHOLHDL, LDLDIRECT in the last 72 hours. Thyroid Function Tests: No results for input(s): TSH, T4TOTAL, FREET4, T3FREE, THYROIDAB in the last 72 hours. Anemia Panel: No results for input(s): VITAMINB12, FOLATE, FERRITIN, TIBC, IRON, RETICCTPCT in the last 72 hours. Urine analysis:    Component Value Date/Time   COLORURINE YELLOW 01/13/2019 2221   APPEARANCEUR CLEAR 01/13/2019 2221   LABSPEC 1.003 (L) 01/13/2019 2221   PHURINE 6.0 01/13/2019 2221   GLUCOSEU NEGATIVE 01/13/2019 2221   HGBUR SMALL (A) 01/13/2019 2221   BILIRUBINUR NEGATIVE 01/13/2019 2221   KETONESUR NEGATIVE 01/13/2019 2221   PROTEINUR NEGATIVE  01/13/2019 2221   UROBILINOGEN 0.2 11/27/2012 0455   NITRITE NEGATIVE 01/13/2019 2221   LEUKOCYTESUR NEGATIVE 01/13/2019 2221   Sepsis Labs: _0 (procalcitonin:4,lacticidven:4) )No results found for this or any previous visit (from the past 240 hour(s)).   Radiological Exams on Admission: Ct Head Wo Contrast  Result Date: 01/13/2019 CLINICAL DATA:  Seizure, fall. EXAM: CT HEAD WITHOUT CONTRAST CT MAXILLOFACIAL WITHOUT CONTRAST CT CERVICAL SPINE WITHOUT CONTRAST TECHNIQUE: Multidetector CT imaging of the head, cervical spine, and maxillofacial structures were performed using the standard protocol without intravenous contrast. Multiplanar CT image reconstructions of the cervical spine and maxillofacial structures were also generated. COMPARISON:  None. FINDINGS: CT HEAD FINDINGS Brain: No acute intracranial abnormality. Specifically, no hemorrhage, hydrocephalus, mass lesion, acute infarction, or significant intracranial injury. Vascular: No hyperdense vessel or unexpected calcification. Skull: No acute calvarial abnormality. Other: None CT MAXILLOFACIAL FINDINGS Osseous: No fracture or mandibular dislocation. No destructive process. Orbits: Negative. No traumatic or inflammatory finding. Sinuses: Clear Soft tissues: Soft tissue swelling  over the right lateral orbit and face. CT CERVICAL SPINE FINDINGS Alignment: No subluxation Skull base and vertebrae: No acute fracture. No primary bone lesion or focal pathologic process. Soft tissues and spinal canal: No prevertebral fluid or swelling. No visible canal hematoma. Disc levels:  Maintained.  Degenerative facet disease bilaterally. Upper chest: No acute findings Other: None IMPRESSION: No acute intracranial abnormality. No facial or cervical spine fracture. Electronically Signed   By: Rolm Baptise M.D.   On: 01/13/2019 22:30   Ct Cervical Spine Wo Contrast  Result Date: 01/13/2019 CLINICAL DATA:  Seizure, fall. EXAM: CT HEAD WITHOUT CONTRAST CT  MAXILLOFACIAL WITHOUT CONTRAST CT CERVICAL SPINE WITHOUT CONTRAST TECHNIQUE: Multidetector CT imaging of the head, cervical spine, and maxillofacial structures were performed using the standard protocol without intravenous contrast. Multiplanar CT image reconstructions of the cervical spine and maxillofacial structures were also generated. COMPARISON:  None. FINDINGS: CT HEAD FINDINGS Brain: No acute intracranial abnormality. Specifically, no hemorrhage, hydrocephalus, mass lesion, acute infarction, or significant intracranial injury. Vascular: No hyperdense vessel or unexpected calcification. Skull: No acute calvarial abnormality. Other: None CT MAXILLOFACIAL FINDINGS Osseous: No fracture or mandibular dislocation. No destructive process. Orbits: Negative. No traumatic or inflammatory finding. Sinuses: Clear Soft tissues: Soft tissue swelling over the right lateral orbit and face. CT CERVICAL SPINE FINDINGS Alignment: No subluxation Skull base and vertebrae: No acute fracture. No primary bone lesion or focal pathologic process. Soft tissues and spinal canal: No prevertebral fluid or swelling. No visible canal hematoma. Disc levels:  Maintained.  Degenerative facet disease bilaterally. Upper chest: No acute findings Other: None IMPRESSION: No acute intracranial abnormality. No facial or cervical spine fracture. Electronically Signed   By: Rolm Baptise M.D.   On: 01/13/2019 22:30   US Abdomen Limited  Result Date: 01/14/2019 CLINICAL DATA:  43 year old male with abnormal liver enzymes. Seizure, fall. EXAM: ULTRASOUND ABDOMEN LIMITED RIGHT UPPER QUADRANT COMPARISON:  None. FINDINGS: Gallbladder: No gallstones or wall thickening visualized. No sonographic Murphy sign noted by sonographer. Common bile duct: Diameter: 4 millimeters, normal. Liver: Echogenic liver (image 9). No discrete liver lesion. No intrahepatic biliary ductal dilatation. Portal vein is patent on color Doppler imaging with normal direction of  blood flow towards the liver. Other findings: Negative visible right kidney.  No free fluid. IMPRESSION: 1. Hepatic steatosis. 2. Otherwise normal right upper quadrant ultrasound. Electronically Signed   By: Genevie Ann M.D.   On: 01/14/2019 01:17   Ct Maxillofacial Wo Cm  Result Date: 01/13/2019 CLINICAL DATA:  Seizure, fall. EXAM: CT HEAD WITHOUT CONTRAST CT MAXILLOFACIAL WITHOUT CONTRAST CT CERVICAL SPINE WITHOUT CONTRAST TECHNIQUE: Multidetector CT imaging of the head, cervical spine, and maxillofacial structures were performed using the standard protocol without intravenous contrast. Multiplanar CT image reconstructions of the cervical spine and maxillofacial structures were also generated. COMPARISON:  None. FINDINGS: CT HEAD FINDINGS Brain: No acute intracranial abnormality. Specifically, no hemorrhage, hydrocephalus, mass lesion, acute infarction, or significant intracranial injury. Vascular: No hyperdense vessel or unexpected calcification. Skull: No acute calvarial abnormality. Other: None CT MAXILLOFACIAL FINDINGS Osseous: No fracture or mandibular dislocation. No destructive process. Orbits: Negative. No traumatic or inflammatory finding. Sinuses: Clear Soft tissues: Soft tissue swelling over the right lateral orbit and face. CT CERVICAL SPINE FINDINGS Alignment: No subluxation Skull base and vertebrae: No acute fracture. No primary bone lesion or focal pathologic process. Soft tissues and spinal canal: No prevertebral fluid or swelling. No visible canal hematoma. Disc levels:  Maintained.  Degenerative facet disease bilaterally. Upper chest: No  acute findings Other: None IMPRESSION: No acute intracranial abnormality. No facial or cervical spine fracture. Electronically Signed   By: Rolm Baptise M.D.   On: 01/13/2019 22:30    EKG: Independently reviewed. Sinus tachycardia, rate 101, RAD, QTc 503 ms.   Assessment/Plan   1. Acute encephalopathy; seizure-like activity  - Seen in a parking lot with  seizure-like activity, confused with EMS and oriented to person only on arrival to ED  - Head CT is negative for acute findings  - Patient denies hx of seizure but acknowledges alcohol dependence with tremors and sweats before he starts drinking, has EtOH level of <10 in ED, and this is most likely alcohol-withdrawal seizure  - His confusion appears to have resolved in ED, was likely postictal  - Continue CIWA with Ativan, vitamins, seizure precautions    2. Alcoholism with withdrawal  - Patient reports drinking four 40-oz beers every day and often develops tremor and sweating before drinking but denies hx of DT or seizure  - He is tremulous in ED and is being treated with Ativan according to CIWA scoring and vitamins are being supplemented  - Social work consulted for possible resources    3. Elevated transaminases - Alk phos is 241 in ED, bilirubin wnl, AST 1511, and ALT 570  - RUQ [place] with steatosis, otherwise normal  - Likely secondary to alcohol abuse though elevations higher than would be expected  - Viral hepatitis panel pending, trend LFT's    4. Thrombocytopenia  - Platelets 85k on admission without bleeding or evidence for infection  - Likely related to alcoholism, monitor   5. Hyponatremia  - Serum sodium is 131 in setting of hypovolemia and excessive beer drinking  - He was given a liter of NS in ED and currently receiving banana bag  - Repeat chem panel in am    6. Prolonged QT interval  - QTc is 503 ms on admission  - Continue cardiac monitoring, check magnesium level, avoid QT-prolonging medications, and repeat EKG in am    PPE: Mask, face shield  DVT prophylaxis: SCD's  Code Status: Full  Family Communication: Discussed with patient  Consults called: None  Admission status: Observation     Vianne Bulls, MD [facility] Pager [phone]  If 7PM-7AM, please contact night-coverage [email or web address] Password [email or web address]  01/14/2019, 2:03 AM

## 2019-01-15 ENCOUNTER — Inpatient Hospital Stay (HOSPITAL_COMMUNITY): Payer: Self-pay

## 2019-01-15 DIAGNOSIS — F10239 Alcohol dependence with withdrawal, unspecified: Principal | ICD-10-CM

## 2019-01-15 DIAGNOSIS — F1020 Alcohol dependence, uncomplicated: Secondary | ICD-10-CM

## 2019-01-15 DIAGNOSIS — E871 Hypo-osmolality and hyponatremia: Secondary | ICD-10-CM

## 2019-01-15 DIAGNOSIS — R740 Nonspecific elevation of levels of transaminase and lactic acid dehydrogenase [LDH]: Secondary | ICD-10-CM

## 2019-01-15 DIAGNOSIS — R569 Unspecified convulsions: Secondary | ICD-10-CM

## 2019-01-15 LAB — HEPATIC FUNCTION PANEL
ALT: 248 U/L — ABNORMAL HIGH (ref 0–44)
AST: 250 U/L — ABNORMAL HIGH (ref 15–41)
Albumin: 2.8 g/dL — ABNORMAL LOW (ref 3.5–5.0)
Alkaline Phosphatase: 163 U/L — ABNORMAL HIGH (ref 38–126)
Bilirubin, Direct: 0.2 mg/dL (ref 0.0–0.2)
Indirect Bilirubin: 0.9 mg/dL (ref 0.3–0.9)
Total Bilirubin: 1.1 mg/dL (ref 0.3–1.2)
Total Protein: 5.7 g/dL — ABNORMAL LOW (ref 6.5–8.1)

## 2019-01-15 LAB — HEPATITIS PANEL, ACUTE
HCV Ab: <0.1 s/co ratio (ref 0.0–0.9)
Hep A IgM: NEGATIVE
Hep B C IgM: NEGATIVE
Hepatitis B Surface Ag: NEGATIVE

## 2019-01-15 LAB — BASIC METABOLIC PANEL
Anion gap: 11 (ref 5–15)
BUN: <5 mg/dL — ABNORMAL LOW (ref 6–20)
CO2: 22 mmol/L (ref 22–32)
Calcium: 8.8 mg/dL — ABNORMAL LOW (ref 8.9–10.3)
Chloride: 106 mmol/L (ref 98–111)
Creatinine, Ser: 0.67 mg/dL (ref 0.61–1.24)
GFR calc Af Amer: >60 mL/min (ref >60)
GFR calc non Af Amer: >60 mL/min (ref >60)
Glucose, Bld: 100 mg/dL — ABNORMAL HIGH (ref 70–99)
Potassium: 3.5 mmol/L (ref 3.5–5.1)
Sodium: 139 mmol/L (ref 135–145)

## 2019-01-15 MED ORDER — DICLOFENAC SODIUM 1 % TD GEL: 2.0000 g | Freq: Four times a day (QID) | TRANSDERMAL | 0 refills | Status: DC

## 2019-01-15 MED FILL — DICLOFENAC SODIUM 1 % GEL: 1 | 10 days supply | Qty: 100 | Fill #0

## 2019-01-15 NOTE — TOC Transition Note (Signed)
Transition of Care [facility]) - CM/SW Discharge Note Marvetta Gibbons RN, BSN Transitions of Care Unit 4E- RN Case Manager [phone]   Patient Details  Name: Glover Capano MRN: [id number] Date of Birth: 01/29/1976  Transition of Care [facility]) CM/SW Contact:  Dawayne Patricia, RN Phone Number: 01/15/2019, 2:35 PM   Clinical Narrative:    Pt for transition home today- CM spoke with pt at bedside- per pt he states he is just traveling through and does not plan to stay here local for long- his plans are to travel to [place] soon where his cousin is. Offered pt PCP appointment which was made with [facility] for Aug- 5- pt not sure he will still be here at that time- explained pt can cancel the appointment if he leaves- if not he can keep it- confirmed phone # is correct in epic. Also discussed HH recommendations and Char[facility] options for [facility]- however pt refused HH as he repeated that his plans were to "move on down the road"  Script sent to [facility]- call placed for cost of drug which is $5- TOC to f/u with pt to see if he wants to fill it. Per pt he is staying at the [employer] while here in town.   Final next level of care: [place] Barriers to Discharge: No Barriers Identified   Patient Goals and CMS Choice Patient states their goals for this hospitalization and ongoing recovery are:: "to get on down the road to [place] where my cousin is" CMS Medicare.gov Compare Post Acute Care list provided to:: Patient Choice offered to / list presented to : Patient  Discharge Placement  Home/Hotel                     Discharge Plan and Services In-house Referral: Clinical Social Work Discharge Planning Services: CM Consult, [facility], Follow-up appt scheduled Post Acute Care Choice: Home Health          DME Arranged: N/A DME Agency: NA       HH Arranged: PT, Patient Refused [place] Agency: NA        Social Determinants of Health (SDOH) Interventions      Readmission Risk Interventions Readmission Risk Prevention Plan 01/15/2019  Post Dischage Appt Complete  Medication Screening Complete  Transportation Screening Complete  Some recent data might be hidden
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
## 2019-01-15 NOTE — Discharge Summary (Signed)
Physician Discharge Summary  Einar Crowdamou Dileo ZOX:[id number] DOB: 09/28/1975 DOA: 01/13/2019  PCP: Patient, No Pcp Per  Admit date: 01/13/2019 Discharge date: 01/15/2019  Admitted From: Home Disposition: Home  Recommendations for Outpatient Follow-up:  1. Follow up with PCP in 1 week with repeat CBC/CMP 2. Outpatient follow-up with orthopedics for right knee/hip pain 3. Might benefit from outpatient neurology evaluation 4. Abstain from alcohol 5. Follow up in ED if symptoms worsen or new appear   Home Health: No Equipment/Devices: None  Discharge Condition: Guarded CODE STATUS: Full Diet recommendation: Heart healthy  Brief/Interim Summary: 43 year old male with history of alcoholism presented on 01/13/2019 when he was found on ground shaking, confused.  Noncontrast CT head was negative for acute abnormality.  CT cervical spine and maxillofacial was negative for acute fractures.  Right upper quadrant ultrasound was consistent with hepatic steatosis.  He had elevated LFTs on presentation.  He was admitted for alcohol withdrawal and started on IV fluids and CIWA protocol.  Urine drug screen was positive for benzodiazepines.  His LFTs are improving.  PT recommended home health PT.  No further seizure-like activity noted during the hospitalization.  He will be discharged.  He will need close follow-up with PCP.  Might need neurology evaluation as an outpatient if he has seizure-like activities.  Discharge Diagnoses:   Alcohol withdrawal with probable seizure, most likely alcohol withdrawal seizure History of alcohol abuse -Presented with questionable seizure-like activity.  No seizure activity since admission.  Has not received any Ativan since yesterday morning.  Currently on CIWA protocol.  Continue multivitamin, thiamine and folate upon discharge.  Social worker consult to provide outpatient resources for alcohol abuse. -He is medically stable for discharge.  PT recommended home health  PT. -CT of the brain and cervical spine was negative for acute abnormality on presentation  Acute alcoholic hepatitis  -LFTs improving.  Right upper quadrant ultrasound showed hepatic steatosis.  Outpatient follow-up.  Hyponatremia and hypokalemia  -Improved.  Right hip and knee pain -Apparently he has ongoing symptoms for a while now.  X-rays of the hip and knee unremarkable.  Recommend outpatient orthopedic evaluation.  Discharge Instructions  Discharge Instructions    AMB referral to orthopedics   Complete by: As directed    Right hip/knee pain   Diet - low sodium heart healthy   Complete by: As directed    Increase activity slowly   Complete by: As directed      Allergies as of 01/15/2019   No Known Allergies     Medication List    STOP taking these medications   MEDROL (PAK) PO     TAKE these medications   cyclobenzaprine 10 MG tablet Commonly known as: FLEXERIL Take 10 mg by mouth at bedtime as needed for muscle spasms.   diclofenac sodium 1 % Gel Commonly known as: VOLTAREN Apply 2 g topically 4 (four) times daily.   gabapentin 300 MG capsule Commonly known as: NEURONTIN Take 300 mg by mouth 3 (three) times daily.   ibuprofen 400 MG tablet Commonly known as: ADVIL Take 400 mg by mouth 3 (three) times daily.      Follow-up Information    Schedule an appointment as soon as possible for a visit  with [facility].   Contact information: [address], [address] [place] [id number] [phone]       Schedule an appointment as soon as possible for a visit  with Filbert BertholdKnox, Robert, DDS.   Specialty: Dentistry Contact information: [facility] [address], [address] [place] [address] [phone]        Schedule an appointment as soon as possible for a visit  with [facility].   Contact information: [address] [place]  [phone]         No Known Allergies  Consultations:  None   Procedures/Studies: Dg Knee 1-2 Views Right  Result Date: 01/15/2019 CLINICAL DATA:  Pain without injury. EXAM: RIGHT KNEE - 1-2 VIEW COMPARISON:  None. FINDINGS: No evidence of fracture, dislocation, or joint effusion. No evidence of arthropathy or other focal bone abnormality. Soft tissues are unremarkable. IMPRESSION: Negative. Electronically Signed   By: Gerome Samavid  Williams III M.D   On: 01/15/2019 10:58   Ct Head Wo Contrast  Result Date: 01/13/2019 CLINICAL DATA:  Seizure, fall. EXAM: CT HEAD WITHOUT CONTRAST CT MAXILLOFACIAL WITHOUT CONTRAST CT CERVICAL SPINE WITHOUT CONTRAST TECHNIQUE: Multidetector CT imaging of the head, cervical spine, and maxillofacial structures were performed using the standard protocol without intravenous contrast. Multiplanar CT image reconstructions of the cervical spine and maxillofacial structures were also generated. COMPARISON:  None. FINDINGS: CT HEAD FINDINGS Brain: No acute intracranial abnormality. Specifically, no hemorrhage, hydrocephalus, mass lesion, acute infarction, or significant intracranial injury. Vascular: No hyperdense vessel or unexpected calcification. Skull: No acute calvarial abnormality. Other: None CT MAXILLOFACIAL FINDINGS Osseous: No fracture or mandibular dislocation. No destructive process. Orbits: Negative. No traumatic or inflammatory finding. Sinuses: Clear Soft tissues: Soft tissue swelling over the right lateral orbit and face. CT CERVICAL SPINE FINDINGS Alignment: No subluxation Skull base and vertebrae: No acute fracture. No primary bone lesion or focal pathologic process. Soft tissues and spinal canal: No prevertebral fluid or swelling. No visible canal hematoma. Disc levels:  Maintained.  Degenerative facet disease bilaterally. Upper chest: No acute findings Other: None IMPRESSION: No acute intracranial abnormality. No facial or cervical spine fracture.  Electronically Signed   By: Charlett NoseKevin  Dover M.D.   On: 01/13/2019 22:30   Ct Cervical Spine Wo Contrast  Result Date: 01/13/2019 CLINICAL DATA:  Seizure, fall. EXAM: CT HEAD WITHOUT CONTRAST CT MAXILLOFACIAL WITHOUT CONTRAST CT CERVICAL SPINE WITHOUT CONTRAST TECHNIQUE: Multidetector CT imaging of the head, cervical spine, and maxillofacial structures were performed using the standard protocol without intravenous contrast. Multiplanar CT image reconstructions of the cervical spine and maxillofacial structures were also generated. COMPARISON:  None. FINDINGS: CT HEAD FINDINGS Brain: No acute intracranial abnormality. Specifically, no hemorrhage, hydrocephalus, mass lesion, acute infarction, or significant intracranial injury. Vascular: No hyperdense vessel or unexpected calcification. Skull: No acute calvarial abnormality. Other: None CT MAXILLOFACIAL FINDINGS Osseous: No fracture or mandibular dislocation. No destructive process. Orbits: Negative. No traumatic or inflammatory finding. Sinuses: Clear Soft tissues: Soft tissue swelling over the right lateral orbit and face. CT CERVICAL SPINE FINDINGS Alignment: No subluxation Skull base and vertebrae: No acute fracture. No primary bone lesion or focal pathologic process. Soft tissues and spinal canal: No prevertebral fluid or swelling. No visible canal hematoma. Disc levels:  Maintained.  Degenerative facet disease bilaterally. Upper chest: No acute findings Other: None IMPRESSION: No acute intracranial abnormality. No facial or cervical spine fracture. Electronically Signed   By: Charlett NoseKevin  Dover M.D.   On: 01/13/2019 22:30   [place] Abdomen Limited  Result Date: 01/14/2019 CLINICAL DATA:  43 year old male with abnormal liver enzymes. Seizure, fall. EXAM: ULTRASOUND ABDOMEN LIMITED RIGHT UPPER QUADRANT COMPARISON:  None. FINDINGS: Gallbladder: No gallstones or wall thickening visualized. No sonographic Eulah PontMurphy  sign noted by sonographer. Common bile duct: Diameter: 4  millimeters, normal. Liver: Echogenic liver (image 9). No discrete liver lesion. No intrahepatic biliary ductal dilatation. Portal vein is patent on color Doppler imaging with normal direction of blood flow towards the liver. Other findings: Negative visible right kidney.  No free fluid. IMPRESSION: 1. Hepatic steatosis. 2. Otherwise normal right upper quadrant ultrasound. Electronically Signed   By: Odessa Fleming M.D.   On: 01/14/2019 01:17   Dg Hip Unilat With Pelvis 2-3 Views Right  Result Date: 01/15/2019 CLINICAL DATA:  Pain without injury EXAM: DG HIP (WITH OR WITHOUT PELVIS) 2-3V RIGHT COMPARISON:  None. FINDINGS: Enthesopathic changes are seen off the right greater trochanter. No other abnormalities. IMPRESSION: Enthesopathic changes off the right greater trochanter. No fractures, dislocations, or other abnormalities. Electronically Signed   By: Gerome Sam III M.D   On: 01/15/2019 10:58   Ct Maxillofacial Wo Cm  Result Date: 01/13/2019 CLINICAL DATA:  Seizure, fall. EXAM: CT HEAD WITHOUT CONTRAST CT MAXILLOFACIAL WITHOUT CONTRAST CT CERVICAL SPINE WITHOUT CONTRAST TECHNIQUE: Multidetector CT imaging of the head, cervical spine, and maxillofacial structures were performed using the standard protocol without intravenous contrast. Multiplanar CT image reconstructions of the cervical spine and maxillofacial structures were also generated. COMPARISON:  None. FINDINGS: CT HEAD FINDINGS Brain: No acute intracranial abnormality. Specifically, no hemorrhage, hydrocephalus, mass lesion, acute infarction, or significant intracranial injury. Vascular: No hyperdense vessel or unexpected calcification. Skull: No acute calvarial abnormality. Other: None CT MAXILLOFACIAL FINDINGS Osseous: No fracture or mandibular dislocation. No destructive process. Orbits: Negative. No traumatic or inflammatory finding. Sinuses: Clear Soft tissues: Soft tissue swelling over the right lateral orbit and face. CT CERVICAL SPINE  FINDINGS Alignment: No subluxation Skull base and vertebrae: No acute fracture. No primary bone lesion or focal pathologic process. Soft tissues and spinal canal: No prevertebral fluid or swelling. No visible canal hematoma. Disc levels:  Maintained.  Degenerative facet disease bilaterally. Upper chest: No acute findings Other: None IMPRESSION: No acute intracranial abnormality. No facial or cervical spine fracture. Electronically Signed   By: Charlett Nose M.D.   On: 01/13/2019 22:30       Subjective: Patient seen and examined at bedside.  He is a very poor historian.  Denies worsening fever, nausea or vomiting.  Discharge Exam: Vitals:   01/15/19 0535 01/15/19 0826  BP: 102/70 100/80  Pulse:  (!) 56  Resp: 15 19  Temp: 98 F (36.7 C) 98.2 F (36.8 C)  SpO2: 99% 100%    General: Pt is alert, awake, not in acute distress.  Poor historian. Cardiovascular: rate controlled, S1/S2 + Respiratory: bilateral decreased breath sounds at bases Abdominal: Soft, NT, ND, bowel sounds + Extremities: no edema, no cyanosis    The results of significant diagnostics from this hospitalization (including imaging, microbiology, ancillary and laboratory) are listed below for reference.     Microbiology: Recent Results (from the past 240 hour(s))  SARS Coronavirus 2 (CEPHEID - Performed in [facility] hospital lab), Hosp Order     Status: None   Collection Time: 01/14/19  1:26 AM   Specimen: Nasopharyngeal Swab  Result Value Ref Range Status   SARS Coronavirus 2 NEGATIVE NEGATIVE Final    Comment: (NOTE) If result is NEGATIVE SARS-CoV-2 target nucleic acids are NOT DETECTED. The SARS-CoV-2 RNA is generally detectable in upper and lower  respiratory specimens during the acute phase of infection. The lowest  concentration of SARS-CoV-2 viral copies this assay can detect is 250  copies /  mL. A negative result does not preclude SARS-CoV-2 infection  and should not be used as the sole basis for  treatment or other  patient management decisions.  A negative result may occur with  improper specimen collection / handling, submission of specimen other  than nasopharyngeal swab, presence of viral mutation(s) within the  areas targeted by this assay, and inadequate number of viral copies  (<250 copies / mL). A negative result must be combined with clinical  observations, patient history, and epidemiological information. If result is POSITIVE SARS-CoV-2 target nucleic acids are DETECTED. The SARS-CoV-2 RNA is generally detectable in upper and lower  respiratory specimens dur ing the acute phase of infection.  Positive  results are indicative of active infection with SARS-CoV-2.  Clinical  correlation with patient history and other diagnostic information is  necessary to determine patient infection status.  Positive results do  not rule out bacterial infection or co-infection with other viruses. If result is PRESUMPTIVE POSTIVE SARS-CoV-2 nucleic acids MAY BE PRESENT.   A presumptive positive result was obtained on the submitted specimen  and confirmed on repeat testing.  While 2019 novel coronavirus  (SARS-CoV-2) nucleic acids may be present in the submitted sample  additional confirmatory testing may be necessary for epidemiological  and / or clinical management purposes  to differentiate between  SARS-CoV-2 and other Sarbecovirus currently known to infect humans.  If clinically indicated additional testing with an alternate test  methodology [phone]([id number]) is advised. The SARS-CoV-2 RNA is generally  detectable in upper and lower respiratory sp ecimens during the acute  phase of infection. The expected result is Negative. Fact Sheet for Patients:  BoilerBrush.com.cyhttps://www.fda.gov/media/136312/download Fact Sheet for Healthcare Providers: https://pope.com/https://www.fda.gov/media/136313/download This test is not yet approved or cleared by the [place] FDA and has been authorized for detection and/or  diagnosis of SARS-CoV-2 by FDA under an Emergency Use Authorization (EUA).  This EUA will remain in effect (meaning this test can be used) for the duration of the COVID-19 declaration under Section 564(b)(1) of the Act, 21 U.S.C. section 360bbb-3(b)(1), unless the authorization is terminated or revoked sooner. Performed at [facility], [address] [place], [place] [address]      Labs: BNP (last 3 results) No results for input(s): BNP in the last 8760 hours. Basic Metabolic Panel: Recent Labs  Lab 01/13/19 2051 01/14/19 0435 01/15/19 0340  NA 131* 136 139  K 4.0 3.3* 3.5  CL 93* 101 106  CO2 22 25 22   GLUCOSE 112* 95 100*  BUN <5* <5* <5*  CREATININE 0.90 0.68 0.67  CALCIUM 9.5 8.8* 8.8*  MG  --  2.2  --    Liver Function Tests: Recent Labs  Lab 01/13/19 2051 01/14/19 0435 01/15/19 0340  AST 1,511* 759* 250*  ALT 570* 392* 248*  ALKPHOS 241* 201* 163*  BILITOT 0.6 1.0 1.1  PROT 7.3 6.2* 5.7*  ALBUMIN 3.6 3.0* 2.8*   No results for input(s): LIPASE, AMYLASE in the last 168 hours. Recent Labs  Lab 01/14/19 0435  AMMONIA 58*   CBC: Recent Labs  Lab 01/13/19 2051 01/14/19 0435  WBC 4.4 4.0  NEUTROABS 3.0 2.9  HGB 14.1 12.9*  HCT 40.6 37.3*  MCV 100.0 99.2  PLT 85* 80*   Cardiac Enzymes: No results for input(s): CKTOTAL, CKMB, CKMBINDEX, TROPONINI in the last 168 hours. BNP: Invalid input(s): POCBNP CBG: Recent Labs  Lab 01/13/19 2039  GLUCAP 113*   D-Dimer No results for input(s): DDIMER in the last 72  hours. Hgb A1c No results for input(s): HGBA1C in the last 72 hours. Lipid Profile No results for input(s): CHOL, HDL, LDLCALC, TRIG, CHOLHDL, LDLDIRECT in the last 72 hours. Thyroid function studies No results for input(s): TSH, T4TOTAL, T3FREE, THYROIDAB in the last 72 hours.  Invalid input(s): FREET3 Anemia work up No results for input(s): VITAMINB12, FOLATE, FERRITIN, TIBC, IRON, RETICCTPCT in the last 72 hours. Urinalysis     Component Value Date/Time   COLORURINE YELLOW 01/13/2019 2221   APPEARANCEUR CLEAR 01/13/2019 2221   LABSPEC 1.003 (L) 01/13/2019 2221   PHURINE 6.0 01/13/2019 2221   GLUCOSEU NEGATIVE 01/13/2019 2221   HGBUR SMALL (A) 01/13/2019 2221   BILIRUBINUR NEGATIVE 01/13/2019 2221   KETONESUR NEGATIVE 01/13/2019 2221   PROTEINUR NEGATIVE 01/13/2019 2221   UROBILINOGEN 0.2 11/27/2012 0455   NITRITE NEGATIVE 01/13/2019 2221   LEUKOCYTESUR NEGATIVE 01/13/2019 2221   Sepsis Labs Invalid input(s): PROCALCITONIN,  WBC,  LACTICIDVEN Microbiology Recent Results (from the past 240 hour(s))  SARS Coronavirus 2 (CEPHEID - Performed in [facility] hospital lab), Hosp Order     Status: None   Collection Time: 01/14/19  1:26 AM   Specimen: Nasopharyngeal Swab  Result Value Ref Range Status   SARS Coronavirus 2 NEGATIVE NEGATIVE Final    Comment: (NOTE) If result is NEGATIVE SARS-CoV-2 target nucleic acids are NOT DETECTED. The SARS-CoV-2 RNA is generally detectable in upper and lower  respiratory specimens during the acute phase of infection. The lowest  concentration of SARS-CoV-2 viral copies this assay can detect is 250  copies / mL. A negative result does not preclude SARS-CoV-2 infection  and should not be used as the sole basis for treatment or other  patient management decisions.  A negative result may occur with  improper specimen collection / handling, submission of specimen other  than nasopharyngeal swab, presence of viral mutation(s) within the  areas targeted by this assay, and inadequate number of viral copies  (<250 copies / mL). A negative result must be combined with clinical  observations, patient history, and epidemiological information. If result is POSITIVE SARS-CoV-2 target nucleic acids are DETECTED. The SARS-CoV-2 RNA is generally detectable in upper and lower  respiratory specimens dur ing the acute phase of infection.  Positive  results are indicative of active  infection with SARS-CoV-2.  Clinical  correlation with patient history and other diagnostic information is  necessary to determine patient infection status.  Positive results do  not rule out bacterial infection or co-infection with other viruses. If result is PRESUMPTIVE POSTIVE SARS-CoV-2 nucleic acids MAY BE PRESENT.   A presumptive positive result was obtained on the submitted specimen  and confirmed on repeat testing.  While 2019 novel coronavirus  (SARS-CoV-2) nucleic acids may be present in the submitted sample  additional confirmatory testing may be necessary for epidemiological  and / or clinical management purposes  to differentiate between  SARS-CoV-2 and other Sarbecovirus currently known to infect humans.  If clinically indicated additional testing with an alternate test  methodology [phone]) is advised. The SARS-CoV-2 RNA is generally  detectable in upper and lower respiratory sp ecimens during the acute  phase of infection. The expected result is Negative. Fact Sheet for Patients:  BoilerBrush.com.cy Fact Sheet for Healthcare Providers: https://pope.com/ This test is not yet approved or cleared by the [place] FDA and has been authorized for detection and/or diagnosis of SARS-CoV-2 by FDA under an Emergency Use Authorization (EUA).  This EUA will remain in effect (meaning this test can  be used) for the duration of the COVID-19 declaration under Section 564(b)(1) of the Act, 21 U.S.C. section 360bbb-3(b)(1), unless the authorization is terminated or revoked sooner. Performed at [facility], [place] [address], [place], [place] [address]      Time coordinating discharge: 35 minutes  SIGNED:   Aline August, MD  [facility] 01/15/2019, 1:27 PM

## 2019-01-15 NOTE — Progress Notes (Signed)
D/C instructions given to patient. Medications and follow-up appointments reviewed. All questions answered. IV removed, clean and intact. Pt to ride the bus home.  Clyde Canterbury, RN

## 2019-01-21 ENCOUNTER — Other Ambulatory Visit: Payer: Self-pay

## 2019-01-21 ENCOUNTER — Encounter: Payer: Self-pay | Admitting: Orthopaedic Surgery

## 2019-01-21 ENCOUNTER — Ambulatory Visit (INDEPENDENT_AMBULATORY_CARE_PROVIDER_SITE_OTHER): Payer: Self-pay | Admitting: Orthopaedic Surgery

## 2019-01-21 DIAGNOSIS — M4807 Spinal stenosis, lumbosacral region: Secondary | ICD-10-CM

## 2019-01-21 NOTE — Progress Notes (Signed)
Office Visit Note   Patient: James Joyce           Date of Birth: [birth year]           MRN: [id number] Visit Date: 01/21/2019              Requested by: Aline August, MD [place],  [place] [address] PCP: Patient, No Pcp Per   Assessment & Plan: Visit Diagnoses:  1. Spinal stenosis of lumbosacral region     Plan: Impression is chronic lumbar radiculopathy right leg.  Patient has failed conservative treatment over the last 6 months therefore we will obtain MRI at this point to evaluate for structural abnormalities.  Follow-up after the MRI.  Follow-Up Instructions: Return if symptoms worsen or fail to improve.   Orders:  Orders Placed This Encounter  Procedures  . MR Lumbar Spine w/o contrast   No orders of the defined types were placed in this encounter.     Procedures: No procedures performed   Clinical Data: No additional findings.   Subjective: Chief Complaint  Patient presents with  . Right Hip - Pain  . Right Knee - Pain    The patient is a pleasant 43 year old gentleman who has recently relocated down to [place] from [place] who comes in with 6 months of lower back pain and right leg radiculopathy all the way down to his right foot.  He states that he has numbness and tingling that is constant and he has difficulty with sitting and walking and standing because of the weakness in his right leg.  He denies any red flag symptoms or bowel bladder incontinence.  He has been using ibuprofen but this has not helped.  He denies any injuries.   Review of Systems  Constitutional: Negative.   All other systems reviewed and are negative.    Objective: Vital Signs: There were no vitals taken for this visit.  Physical Exam Vitals signs and nursing note reviewed.  Constitutional:      Appearance: He is well-developed.  HENT:     Head: Normocephalic and atraumatic.  Eyes:     Pupils: Pupils are equal, round, and reactive to light.   Neck:     Musculoskeletal: Neck supple.  Pulmonary:     Effort: Pulmonary effort is normal.  Abdominal:     Palpations: Abdomen is soft.  Musculoskeletal: Normal range of motion.  Skin:    General: Skin is warm.  Neurological:     Mental Status: He is alert and oriented to person, place, and time.  Psychiatric:        Behavior: Behavior normal.        Thought Content: Thought content normal.        Judgment: Judgment normal.     Ortho Exam Right lower extremity shows weakness in hip flexion, knee extension, ankle dorsiflexion.  Bilateral hyporeflexic patellar reflexes.  No beats of clonus.  Negative Babinski.  Sensation intact to light touch Specialty Comments:  No specialty comments available.  Imaging: No results found.   PMFS History: Patient Active Problem List   Diagnosis Date Noted  . Alcohol withdrawal ([place]) 01/14/2019  . Seizure-like activity ([place]) 01/14/2019  . Thrombocytopenia ([place]) 01/14/2019  . Hyponatremia 01/14/2019  . Transaminasemia 01/14/2019  . Alcoholism ([place]) 01/14/2019  . Prolonged QT interval 01/14/2019  . ABSCESS OF ANAL AND RECTAL REGIONS 12/30/2007  . WEIGHT LOSS 12/30/2007  . EXTERNAL HEMORRHOIDS 12/29/2007  . GASTRITIS 12/29/2007  .  HIATAL HERNIA 12/29/2007  . COLONIC POLYPS, HYPERPLASTIC, HX OF 12/29/2007   History reviewed. No pertinent past medical history.  History reviewed. No pertinent family history.  History reviewed. No pertinent surgical history. Social History   Occupational History  . Not on file  Tobacco Use  . Smoking status: Never Smoker  . Smokeless tobacco: Never Used  Substance and Sexual Activity  . Alcohol use: Yes    Comment: 64oz beers x4  . Drug use: No  . Sexual activity: Not on file

## 2019-02-01 ENCOUNTER — Other Ambulatory Visit: Payer: Self-pay

## 2019-02-01 ENCOUNTER — Emergency Department (HOSPITAL_COMMUNITY)
Admission: EM | Admit: 2019-02-01 | Discharge: 2019-02-01 | Disposition: A | Discharge status: Home / Self Care | Payer: Self-pay | Attending: Emergency Medicine | Admitting: Emergency Medicine

## 2019-02-01 ENCOUNTER — Emergency Department (HOSPITAL_COMMUNITY): Payer: Self-pay

## 2019-02-01 DIAGNOSIS — Z79899 Other long term (current) drug therapy: Secondary | ICD-10-CM | POA: Insufficient documentation

## 2019-02-01 DIAGNOSIS — M5136 Other intervertebral disc degeneration, lumbar region: Secondary | ICD-10-CM

## 2019-02-01 DIAGNOSIS — G8929 Other chronic pain: Secondary | ICD-10-CM

## 2019-02-01 DIAGNOSIS — M5126 Other intervertebral disc displacement, lumbar region: Secondary | ICD-10-CM | POA: Insufficient documentation

## 2019-02-01 LAB — BASIC METABOLIC PANEL
Anion gap: 12 (ref 5–15)
BUN: 4 mg/dL — ABNORMAL LOW (ref 6–20)
CO2: 24 mmol/L (ref 22–32)
Calcium: 8.6 mg/dL — ABNORMAL LOW (ref 8.9–10.3)
Chloride: 103 mmol/L (ref 98–111)
Creatinine, Ser: 0.76 mg/dL (ref 0.61–1.24)
GFR calc Af Amer: >60 mL/min (ref >60)
GFR calc non Af Amer: >60 mL/min (ref >60)
Glucose, Bld: 78 mg/dL (ref 70–99)
Potassium: 4.8 mmol/L (ref 3.5–5.1)
Sodium: 139 mmol/L (ref 135–145)

## 2019-02-01 LAB — CBC WITH DIFFERENTIAL/PLATELET
Abs Immature Granulocytes: 0.01 10*3/uL (ref 0.00–0.07)
Basophils Absolute: 0 10*3/uL (ref 0.0–0.1)
Basophils Relative: 1 %
Eosinophils Absolute: 0 10*3/uL (ref 0.0–0.5)
Eosinophils Relative: 1 %
HCT: 41.3 % (ref 39.0–52.0)
Hemoglobin: 14.1 g/dL (ref 13.0–17.0)
Immature Granulocytes: 0 %
Lymphocytes Relative: 24 %
Lymphs Abs: 0.9 10*3/uL (ref 0.7–4.0)
MCH: 34 pg (ref 26.0–34.0)
MCHC: 34.1 g/dL (ref 30.0–36.0)
MCV: 99.5 fL (ref 80.0–100.0)
Monocytes Absolute: 0.6 10*3/uL (ref 0.1–1.0)
Monocytes Relative: 15 %
Neutro Abs: 2.1 10*3/uL (ref 1.7–7.7)
Neutrophils Relative %: 59 %
Platelets: 159 10*3/uL (ref 150–400)
RBC: 4.15 MIL/uL — ABNORMAL LOW (ref 4.22–5.81)
RDW: 13 % (ref 11.5–15.5)
WBC: 3.6 10*3/uL — ABNORMAL LOW (ref 4.0–10.5)
nRBC: 0 % (ref 0.0–0.2)

## 2019-02-01 MED ORDER — LIDOCAINE 5 % EX PTCH
1.0000 | MEDICATED_PATCH | CUTANEOUS | Status: DC
  Administered 2019-02-01: 20:00:00 1 via TRANSDERMAL
  Filled 2019-02-01: qty 1

## 2019-02-01 MED ORDER — GADOBUTROL 1 MMOL/ML IV SOLN
5.0000 mL | Freq: Once | INTRAVENOUS | Status: AC | PRN
  Administered 2019-02-01: 5 mL via INTRAVENOUS

## 2019-02-01 MED ORDER — LIDOCAINE 5 % EX PTCH: 1.0000 | MEDICATED_PATCH | CUTANEOUS | 0 refills | Status: DC

## 2019-02-01 MED ORDER — MORPHINE SULFATE (PF) 4 MG/ML IV SOLN
4.0000 mg | Freq: Once | INTRAVENOUS | Status: AC
  Administered 2019-02-01: 16:00:00 4 mg via INTRAVENOUS
  Filled 2019-02-01: qty 1

## 2019-02-01 MED ORDER — SODIUM CHLORIDE 0.9 % IV BOLUS
1000.0000 mL | Freq: Once | INTRAVENOUS | Status: AC
  Administered 2019-02-01: 1000 mL via INTRAVENOUS

## 2019-02-01 NOTE — ED Notes (Signed)
Patient back from MRI.

## 2019-02-01 NOTE — ED Notes (Signed)
Patient verbalizes understanding of discharge instructions. Opportunity for questioning and answers were provided. Patient educated about lidocaine patch use at home as well as alcohol detox options listed in discharge papers. pt discharged from ED. ambulatory by self.

## 2019-02-01 NOTE — Discharge Instructions (Addendum)
Use the pain patches as prescribed.  Follow-up outpatient with orthopedic surgery for your chronic back pain.
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
## 2019-02-01 NOTE — ED Notes (Signed)
Pt states that he usually drinks daily due to pain related to R hip, now "when I drink, I stop shaking." Last drink this AM. States he would like to be in [place] program for this.

## 2019-02-01 NOTE — ED Notes (Signed)
Patient transported to MRI 

## 2019-02-01 NOTE — ED Provider Notes (Signed)
[facility] Provider Note   CSN: [id number] Arrival date & time: 02/01/19  1453  History   Chief Complaint Chief Complaint  Patient presents with   Hip Pain   HPI James Joyce is a 43 y.o. male with past medical history significant for gastritis, thrombocytopenia, alcohol dependence, seizure-like activity, chronic back pain who presents for evaluation of right-sided back pain.  Patient has had pain since January of last year.  He has been followed by Dr. Roda ShuttersXu outpatient.  Has outpatient MRI scheduled at the end of this month however patient states pain has increased.  Patient states pain is so bad that he had an episode of bowel bladder incontinence earlier today.  He denies IV drug use.  Rates his current pain a 10/10.  States he has been taking "everything" for his pain is not resolved.  Pain in his pain is not unchanged from the beginning of this year.  Describes his pain is right lower back and radiates down his posterior right leg.  Denies fever, chills, nausea, vomiting, abdominal pain, diarrhea, dysuria, decreased range of motion.  No numbness or tingling.  History obtained from patient and past medical records.  No interpreter is used.       HPI  No past medical history on file.  Patient Active Problem List   Diagnosis Date Noted   Alcohol withdrawal (HCC) 01/14/2019   Seizure-like activity (HCC) 01/14/2019   Thrombocytopenia (HCC) 01/14/2019   Hyponatremia 01/14/2019   Transaminasemia 01/14/2019   Alcoholism (HCC) 01/14/2019   Prolonged QT interval 01/14/2019   ABSCESS OF ANAL AND RECTAL REGIONS 12/30/2007   WEIGHT LOSS 12/30/2007   EXTERNAL HEMORRHOIDS 12/29/2007   GASTRITIS 12/29/2007   HIATAL HERNIA 12/29/2007   COLONIC POLYPS, HYPERPLASTIC, HX OF 12/29/2007    No past surgical history on file.      Home Medications    Prior to Admission medications   Medication Sig Start Date End Date Taking? Authorizing  Provider  cyclobenzaprine (FLEXERIL) 10 MG tablet Take 10 mg by mouth at bedtime as needed for muscle spasms.    [provider]  diclofenac sodium (VOLTAREN) 1 % GEL Apply 2 g topically 4 (four) times daily. 01/15/19   Glade LloydAlekh, Kshitiz, MD  gabapentin (NEURONTIN) 300 MG capsule Take 300 mg by mouth 3 (three) times daily.    [provider]  ibuprofen (ADVIL) 400 MG tablet Take 400 mg by mouth 3 (three) times daily.    [provider]  lidocaine (LIDODERM) 5 % Place 1 patch onto the skin daily. Remove & Discard patch within 12 hours or as directed by MD 02/01/19   Shaunette Gassner A, PA-C    Family History No family history on file.  Social History Social History   Tobacco Use   Smoking status: Never Smoker   Smokeless tobacco: Never Used  Substance Use Topics   Alcohol use: Yes    Comment: 64oz beers x4   Drug use: No     Allergies   Patient has no known allergies.   Review of Systems Review of Systems  Constitutional: Negative.   HENT: Negative.   Respiratory: Negative.   Cardiovascular: Negative.   Gastrointestinal: Negative.   Genitourinary: Negative.   Musculoskeletal: Positive for back pain. Negative for gait problem.  Skin: Negative.   Neurological: Negative.   All other systems reviewed and are negative.   Physical Exam Updated Vital Signs BP 110/82    Pulse 79    Temp 99.2  F (37.3 C) (Oral)    Resp 16    Ht 5\' 11"  (1.803 m)    Wt 51.3 kg    SpO2 97%    BMI 15.77 kg/m   Physical Exam  Physical Exam  Constitutional: Pt appears well-developed and well-nourished. No distress.  HENT:  Head: Normocephalic and atraumatic.  Mouth/Throat: Oropharynx is clear and moist. No oropharyngeal exudate.  Eyes: Conjunctivae are normal.  Neck: Normal range of motion. Neck supple.  Full ROM without pain  Cardiovascular: Normal rate, regular rhythm and intact distal pulses.   Pulmonary/Chest: Effort normal and breath sounds normal. No  respiratory distress. Pt has no wheezes.  Abdominal: Soft. Pt exhibits no distension. There is no tenderness, rebound or guarding. No abd bruit or pulsatile mass Musculoskeletal:  Full range of motion of the T-spine and L-spine with flexion, hyperextension, and lateral flexion. No midline tenderness or stepoffs. No tenderness to palpation of the spinous processes of the T-spine or L-spine. Mild tenderness to palpation of the paraspinous muscles of the RIGHT L-spine. Positive straight leg raise on right at 40'.  Tenderness to piriformis muscle on the right and over SI joint. Lymphadenopathy:    Pt has no cervical adenopathy.  Neurological: Pt is alert. Pt has normal reflexes.  Reflex Scores:      Bicep reflexes are 2+ on the right side and 2+ on the left side.      Brachioradialis reflexes are 2+ on the right side and 2+ on the left side.      Patellar reflexes are 2+ on the right side and 2+ on the left side.      Achilles reflexes are 2+ on the right side and 2+ on the left side. Speech is clear and goal oriented, follows commands Normal 5/5 strength in upper and lower extremities bilaterally including dorsiflexion and plantar flexion, strong and equal grip strength Sensation normal to light and sharp touch Moves extremities without ataxia, coordination intact Normal gait Normal balance No Clonus Skin: Skin is warm and dry. No rash noted or lesions noted. Pt is not diaphoretic. No erythema, ecchymosis,edema or warmth.  Psychiatric: Pt has a normal mood and affect. Behavior is normal.  Nursing note and vitals reviewed. ED Treatments / Results  Labs (all labs ordered are listed, but only abnormal results are displayed) Labs Reviewed  CBC WITH DIFFERENTIAL/PLATELET - Abnormal; Notable for the following components:      Result Value   WBC 3.6 (*)    RBC 4.15 (*)    All other components within normal limits  BASIC METABOLIC PANEL - Abnormal; Notable for the following components:   BUN 4  (*)    Calcium 8.6 (*)    All other components within normal limits    EKG None  Radiology Mr Lumbar Spine W Wo Contrast (assess For Abscess, Cord Compression)  Result Date: 02/01/2019 CLINICAL DATA:  Back pain radiating to the right hip and lower extremity EXAM: MRI LUMBAR SPINE WITHOUT AND WITH CONTRAST TECHNIQUE: Multiplanar and multiecho pulse sequences of the lumbar spine were obtained without and with intravenous contrast. CONTRAST:  5 mL Gadavist COMPARISON:  None. FINDINGS: Segmentation:  Normal Alignment:  Normal Vertebrae: No acute compression fracture, discitis-osteomyelitis, facet edema or other focal marrow lesion. No epidural collection. No abnormal contrast enhancement. Conus medullaris and cauda equina: Conus extends to the L1 level. Conus and cauda equina appear normal. Paraspinal and other soft tissues: Negative. Disc levels: Sagittal imaging of T11-12 and T12-L1 is normal. L1-2: Normal.  L2-3: Intermediate disc bulge, right eccentric, with mild narrowing of the lateral recesses. No central spinal canal stenosis or neural impingement. L3-4: Normal. L4-5: Small left foraminal disc protrusion in close proximity to the exiting left L4 nerve root. No spinal canal or neural foraminal stenosis. L5-S1: Normal. IMPRESSION: 1. No acute abnormality of the lumbar spine. 2. Small L2-3 disc bulge mildly narrowing the lateral recesses without causing spinal canal or neural foraminal stenosis. 3. Small L4-5 left foraminal disc protrusion in close proximity to the exiting left L4 nerve root. Correlate for corresponding radiculopathy. Electronically Signed   By: Deatra RobinsonKevin  Herman M.D.   On: 02/01/2019 19:12    Procedures Procedures (including critical care time)  Medications Ordered in ED Medications  lidocaine (LIDODERM) 5 % 1 patch (has no administration in time range)  sodium chloride 0.9 % bolus 1,000 mL (0 mLs Intravenous Stopped 02/01/19 1754)  morphine 4 MG/ML injection 4 mg (4 mg Intravenous  Given 02/01/19 1621)  gadobutrol (GADAVIST) 1 MMOL/ML injection 5 mL (5 mLs Intravenous Contrast Given 02/01/19 1852)   Initial Impression / Assessment and Plan / ED Course  I have reviewed the triage vital signs and the nursing notes.  Pertinent labs & imaging results that were available during my care of the patient were reviewed by me and considered in my medical decision making (see chart for details).  43 year old male appears otherwise well presents for evaluation of back pain.  Afebrile, nonseptic, non-ill-appearing.  Followed outpatient orthopedics by Dr. Deno Etiennehu.  Worsening right lower back pain.  He is able to ambulate however with pain.  Patient states he did have episode of bowel and bladder incontinence earlier today.  States "I was not able to hold it."  Positive straight leg raise on right at 45 degrees.  Tenderness palpation over his right piriformis and SI joint.  Heart and lungs clear.  Normal reflexes.  No tenderness over the right hip to suggest a septic bursitis.  No overlying skin changes to indicate infectious process.  Given episode of incontinence obtain MRI to rule out cauda equina or other additional processes.  Question acute on chronic back pain.  Patient does have history of chronic alcohol abuse however does not appear to be in withdrawal at this time.  No tachycardia, tachypnea or hypoxia.   Ambulatory in ED without difficulty.  Imaging personally reviewed.  Labs without acute findings MRI with 1. No acute abnormality of the lumbar spine. 2. Small L2-3 disc bulge mildly narrowing the lateral recesses without causing spinal canal or neural foraminal stenosis. 3. Small L4-5 left foraminal disc protrusion in close proximity to the exiting left L4 nerve root. Correlate for corresponding radiculopathy.  No evidence of cauda equina, discitis, osteomyelitis infectious process.  Unsure of cause of patient's prior episode of bowel or bladder incontinence however his abdomen soft, nontender  without rebound or guarding.  Likely patient's acute on chronic pain.  MRI findings correlate with his right lower pain.  Given symptoms have been onset since November we will have him follow-up with orthopedics he currently follows with.  Will DC home with lidocaine patches.  Patient with back pain.  No neurological deficits and normal neuro exam.  Patient can walk but states is painful.  No loss of bowel or bladder control.  No concern for cauda equina.  No fever, night sweats, weight loss, h/o cancer, IVDU.  RICE protocol and pain medicine indicated and discussed with patient.       The patient has been appropriately  medically screened and/or stabilized in the ED. I have low suspicion for any other emergent medical condition which would require further screening, evaluation or treatment in the ED or require inpatient management.  Patient is hemodynamically stable and in no acute distress.  Patient able to ambulate in department prior to ED.  Evaluation does not show acute pathology that would require ongoing or additional emergent interventions while in the emergency department or further inpatient treatment.  I have discussed the diagnosis with the patient and answered all questions.  Pain is been managed while in the emergency department and patient has no further complaints prior to discharge.  Patient is comfortable with plan discussed in room and is stable for discharge at this time.  I have discussed strict return precautions for returning to the emergency department.  Patient was encouraged to follow-up with PCP/specialist refer to at discharge. Final Clinical Impressions(s) / ED Diagnoses   Final diagnoses:  Chronic midline low back pain with right-sided sciatica  L4-L5 disc bulge    ED Discharge Orders         Ordered    lidocaine (LIDODERM) 5 %  Every 24 hours     02/01/19 1934           Jahniah Pallas A, PA-C 02/01/19 1937    Sabas SousBero, Michael M, MD 02/05/19 [phone]

## 2019-02-01 NOTE — ED Notes (Signed)
MRI called, will be in ED to pick up pt shortly, pt IV saline locked and ready for transport.

## 2019-02-03 NOTE — Progress Notes (Signed)
Subjective:    Patient ID: James Joyce, male    DOB: 09/13/1975, 43 y.o.   MRN: [id number]  This is a 43 year old black male originally from [place].  The patient has history of alcoholism and was admitted in July between the 14th and 16th for severe weakness confusion alcohol withdrawal.  Work-up showed hepatic steatosis and increased liver function tests.  Urine drug screens positive for benzodiazepines.  The patient was discharged.  The patient now is living in a motel and is homeless at this time.  The patient's diet is not good.  He does have daily gastritis type symptoms.  History of reflux is noted.  Another issue is that of severe right hip pain with right leg weakness and severe pain in both lower extremities.  He has been to orthopedics recently and work-up there has shown significant lumbar stenosis with also nerve impingement syndrome right worse than left in the lumbar spine  The patient has a pending follow-up with orthopedics on August 24 that he was not aware of  The patient drinks about 6-8 beers every day.  Again his diet is quite poor.  Below is the discharge summary from the July admission  Admit date: 01/13/2019 Discharge date: 01/15/2019  Admitted From: Home Disposition: Home  Recommendations for Outpatient Follow-up:  1. Follow up with PCP in 1 week with repeat CBC/CMP 2. Outpatient follow-up with orthopedics for right knee/hip pain 3. Might benefit from outpatient neurology evaluation 4. Abstain from alcohol 5. Follow up in ED if symptoms worsen or new appear   Home Health: No Equipment/Devices: None  Discharge Condition: Guarded CODE STATUS: Full Diet recommendation: Heart healthy  Brief/Interim Summary: 43 year old male with history of alcoholism presented on 01/13/2019 when he was found on ground shaking, confused.  Noncontrast CT head was negative for acute abnormality.  CT cervical spine and maxillofacial was negative for acute fractures.  Right upper  quadrant ultrasound was consistent with hepatic steatosis.  He had elevated LFTs on presentation.  He was admitted for alcohol withdrawal and started on IV fluids and CIWA protocol.  Urine drug screen was positive for benzodiazepines.  His LFTs are improving.  PT recommended home health PT.  No further seizure-like activity noted during the hospitalization.  He will be discharged.  He will need close follow-up with PCP.  Might need neurology evaluation as an outpatient if he has seizure-like activities.  Discharge Diagnoses:   Alcohol withdrawal with probable seizure, most likely alcohol withdrawal seizure History of alcohol abuse -Presented with questionable seizure-like activity.  No seizure activity since admission.  Has not received any Ativan since yesterday morning.  Currently on CIWA protocol.  Continue multivitamin, thiamine and folate upon discharge.  Social worker consult to provide outpatient resources for alcohol abuse. -He is medically stable for discharge.  PT recommended home health PT. -CT of the brain and cervical spine was negative for acute abnormality on presentation  Acute alcoholic hepatitis  -LFTs improving.  Right upper quadrant ultrasound showed hepatic steatosis.  Outpatient follow-up.  Hyponatremia and hypokalemia  -Improved.  Right hip and knee pain -Apparently he has ongoing symptoms for a while now.  X-rays of the hip and knee unremarkable.  Recommend outpatient orthopedic evaluation.  Also note the patient went to the emergency room on August 2 just this past weekend documentation from that visit is as below and an MRI was done during that visit which showed significant changes.  Note the patient has a follow-up visit with orthopedics to review  this MRI already scheduled  43 year old male appears otherwise well presents for evaluation of back pain.  Afebrile, nonseptic, non-ill-appearing.  Followed outpatient orthopedics by Dr. Deno Etiennehu.  Worsening right lower  back pain.  He is able to ambulate however with pain.  Patient states he did have episode of bowel and bladder incontinence earlier today.  States "I was not able to hold it."  Positive straight leg raise on right at 45 degrees.  Tenderness palpation over his right piriformis and SI joint.  Heart and lungs clear.  Normal reflexes.  No tenderness over the right hip to suggest a septic bursitis.  No overlying skin changes to indicate infectious process.  Given episode of incontinence obtain MRI to rule out cauda equina or other additional processes.  Question acute on chronic back pain.  Patient does have history of chronic alcohol abuse however does not appear to be in withdrawal at this time.  No tachycardia, tachypnea or hypoxia.   Ambulatory in ED without difficulty.  Imaging personally reviewed.  Labs without acute findings MRI with 1. No acute abnormality of the lumbar spine. 2. Small L2-3 disc bulge mildly narrowing the lateral recesses without causing spinal canal or neural foraminal stenosis. 3. Small L4-5 left foraminal disc protrusion in close proximity to the exiting left L4 nerve root. Correlate for corresponding radiculopathy.  No evidence of cauda equina, discitis, osteomyelitis infectious process.  Unsure of cause of patient's prior episode of bowel or bladder incontinence however his abdomen soft, nontender without rebound or guarding.  Likely patient's acute on chronic pain.  MRI findings correlate with his right lower pain.  Given symptoms have been onset since November we will have him follow-up with orthopedics he currently follows with.  Will DC home with lidocaine patches.  Patient with back pain.  No neurological deficits and normal neuro exam.  Patient can walk but states is painful.  No loss of bowel or bladder control.  No concern for cauda equina.  No fever, night sweats, weight loss, h/o cancer, IVDU.  RICE protocol and pain medicine indicated and discussed with patient.     History reviewed. No pertinent past medical history.   History reviewed. No pertinent family history.   Social History   Socioeconomic History   Marital status: Single    Spouse name: Not on file   Number of children: Not on file   Years of education: Not on file   Highest education level: Not on file  Occupational History   Not on file  Social Needs   Financial resource strain: Not on file   Food insecurity    Worry: Not on file    Inability: Not on file   Transportation needs    Medical: Not on file    Non-medical: Not on file  Tobacco Use   Smoking status: Never Smoker   Smokeless tobacco: Never Used  Substance and Sexual Activity   Alcohol use: Yes    Comment: 64oz beers x4   Drug use: No   Sexual activity: Not on file  Lifestyle   Physical activity    Days per week: Not on file    Minutes per session: Not on file   Stress: Not on file  Relationships   Social connections    Talks on phone: Not on file    Gets together: Not on file    Attends religious service: Not on file    Active member of club or organization: Not on file    Attends  meetings of clubs or organizations: Not on file    Relationship status: Not on file   Intimate partner violence    Fear of current or ex partner: Not on file    Emotionally abused: Not on file    Physically abused: Not on file    Forced sexual activity: Not on file  Other Topics Concern   Not on file  Social History Narrative   Not on file     No Known Allergies   Outpatient Medications Prior to Visit  Medication Sig Dispense Refill   cyclobenzaprine (FLEXERIL) 10 MG tablet Take 10 mg by mouth at bedtime as needed for muscle spasms.     diclofenac sodium (VOLTAREN) 1 % GEL Apply 2 g topically 4 (four) times daily. (Patient not taking: Reported on 02/04/2019) 50 g 0   gabapentin (NEURONTIN) 300 MG capsule Take 300 mg by mouth 3 (three) times daily.     ibuprofen (ADVIL) 400 MG tablet Take 400  mg by mouth 3 (three) times daily.     lidocaine (LIDODERM) 5 % Place 1 patch onto the skin daily. Remove & Discard patch within 12 hours or as directed by MD (Patient not taking: Reported on 02/04/2019) 30 patch 0   No facility-administered medications prior to visit.        Review of Systems Constitutional:     weight loss, night sweats,  Fevers, chills, fatigue, lassitude. HEENT:   No headaches,  Difficulty swallowing,  Tooth/dental problems,  Sore throat,                No sneezing, itching, ear ache, nasal congestion, post nasal drip,   CV:  No chest pain,  Orthopnea, PND, swelling in lower extremities, anasarca, dizziness, palpitations  GI   heartburn, indigestion, abdominal pain, nausea, vomiting, diarrhea, change in bowel habits, loss of appetite  Resp: No shortness of breath with exertion or at rest.  No excess mucus, no productive cough,  No non-productive cough,  No coughing up of blood.  No change in color of mucus.  No wheezing.  No chest wall deformity  Skin: no rash or lesions.  GU: no dysuria, change in color of urine, no urgency or frequency.  No flank pain.  MS:   joint pain or swelling.  No decreased range of motion.   back pain.  Psych:   change in mood or affect. ,  depression  anxiety.   memory loss.     Objective:   Physical Exam Vitals:   02/04/19 0928  BP: 119/80  Pulse: 92  Resp: 16  Temp: 98.2 F (36.8 C)  TempSrc: Oral  SpO2: 97%  Weight: 118 lb 3.2 oz (53.6 kg)  Height: 5\' 8"  (1.727 m)    Gen: slightly confused, thin, in no distress,  anxious affect  ENT: No lesions,  mouth clear,  oropharynx clear, no postnasal drip  Neck: No JVD, no TMG, no carotid bruits  Lungs: No use of accessory muscles, no dullness to percussion, clear without rales or rhonchi  Cardiovascular: RRR, heart sounds normal, no murmur or gallops, no peripheral edema  Abdomen: soft and NT, liver enlarged 2FB below RCM  ,  BS normal  Musculoskeletal: No deformities,  no cyanosis or clubbing  Neuro: alert, non focal  Skin: Warm, no lesions or rashes  No results found.  02/01/2019 MRI Lumbar spine IMPRESSION: 1. No acute abnormality of the lumbar spine. 2. Small L2-3 disc bulge mildly narrowing the lateral recesses without causing spinal  canal or neural foraminal stenosis. 3. Small L4-5 left foraminal disc protrusion in close proximity to the exiting left L4 nerve root. Correlate for corresponding radiculopathy.     Assessment & Plan:  I personally reviewed all images and lab data in the [facility] system as well as any outside material available during this office visit and agree with the  radiology impressions.   Alcoholism (HCC) Alcoholism with significant toxic stress and history of alcohol withdrawal  Severe weight loss and active liver disease secondary to alcohol use  Some degree of confusional state may be related to alcohol  Have connected the patient with our RN case manager and licensed clinical social worker today for alcohol substance use therapy  Begin thiamine 100 mg daily and folic acid 1 mg daily  Refill Protonix 40 mg daily    Gastritis and duodenitis Plan to refill the Protonix at 40 mg daily  GERD without esophagitis Protonix is refilled  Hyponatremia History of hyponatremia we will follow-up repeat complete metabolic panel  Thrombocytopenia (HCC) Follow-up CBC  Transaminasemia Check liver function panel  WEIGHT LOSS Weight loss secondary to lack of access to healthy foods  Lumbar back pain with radiculopathy affecting right lower extremity Lumbar back pain with radiculopathy with nerve root impingement affecting right lower extremity and associated lumbar foraminal stenosis  Will prescribe Lidoderm patch and gabapentin along with Flexeril and refer back to orthopedics for review of recent MRI   [place] was seen today for hospitalization follow-up.  Diagnoses and all orders for this visit:  Lumbar back  pain with radiculopathy affecting right lower extremity  Alcoholism (HCC) -     Comprehensive metabolic panel -     Magnesium; Future -     CBC with Differential/Platelet; Future -     Amylase  Transaminasemia -     Comprehensive metabolic panel  Alcohol withdrawal syndrome with complication (HCC) -     Comprehensive metabolic panel  Homelessness  Lumbar foraminal stenosis  Gastroesophageal reflux disease without esophagitis  Hyponatremia  Thrombocytopenia (HCC)  WEIGHT LOSS  Gastritis and duodenitis  GERD without esophagitis  Other orders -     cyclobenzaprine (FLEXERIL) 10 MG tablet; Take 1 tablet (10 mg total) by mouth 3 (three) times daily as needed for muscle spasms. -     Discontinue: lidocaine (LIDODERM) 5 %; Place 1 patch onto the skin daily. Remove & Discard patch within 12 hours or as directed by MD -     gabapentin (NEURONTIN) 300 MG capsule; Take 1 capsule (300 mg total) by mouth 3 (three) times daily. -     thiamine (VITAMIN B-1) 100 MG tablet; Take 1 tablet (100 mg total) by mouth daily. -     folic acid (FOLVITE) 1 MG tablet; Take 1 tablet (1 mg total) by mouth daily. -     pantoprazole (PROTONIX) 40 MG tablet; Take 1 tablet (40 mg total) by mouth daily. -     lidocaine (LIDODERM) 5 %; Place 1 patch onto the skin daily. Remove & Discard patch within 12 hours or as directed by MD

## 2019-02-04 ENCOUNTER — Other Ambulatory Visit: Payer: Self-pay

## 2019-02-04 ENCOUNTER — Encounter: Payer: Self-pay | Admitting: Critical Care Medicine

## 2019-02-04 ENCOUNTER — Ambulatory Visit: Payer: Self-pay | Attending: Family Medicine | Admitting: Licensed Clinical Social Worker

## 2019-02-04 ENCOUNTER — Ambulatory Visit: Payer: Self-pay | Attending: Critical Care Medicine | Admitting: Critical Care Medicine

## 2019-02-04 VITALS — BP 119/80 | HR 92 | Temp 98.2°F | Resp 16 | Ht 68.0 in | Wt 118.2 lb

## 2019-02-04 DIAGNOSIS — Z5900 Homelessness unspecified: Secondary | ICD-10-CM | POA: Insufficient documentation

## 2019-02-04 DIAGNOSIS — K2990 Gastroduodenitis, unspecified, without bleeding: Secondary | ICD-10-CM

## 2019-02-04 DIAGNOSIS — F10939 Alcohol use, unspecified with withdrawal, unspecified: Secondary | ICD-10-CM

## 2019-02-04 DIAGNOSIS — F1020 Alcohol dependence, uncomplicated: Secondary | ICD-10-CM

## 2019-02-04 DIAGNOSIS — K219 Gastro-esophageal reflux disease without esophagitis: Secondary | ICD-10-CM

## 2019-02-04 DIAGNOSIS — F10239 Alcohol dependence with withdrawal, unspecified: Secondary | ICD-10-CM

## 2019-02-04 DIAGNOSIS — M48061 Spinal stenosis, lumbar region without neurogenic claudication: Secondary | ICD-10-CM

## 2019-02-04 DIAGNOSIS — M5416 Radiculopathy, lumbar region: Secondary | ICD-10-CM

## 2019-02-04 DIAGNOSIS — R634 Abnormal weight loss: Secondary | ICD-10-CM

## 2019-02-04 DIAGNOSIS — R740 Nonspecific elevation of levels of transaminase and lactic acid dehydrogenase [LDH]: Secondary | ICD-10-CM

## 2019-02-04 DIAGNOSIS — D696 Thrombocytopenia, unspecified: Secondary | ICD-10-CM

## 2019-02-04 DIAGNOSIS — R7401 Elevation of levels of liver transaminase levels: Secondary | ICD-10-CM

## 2019-02-04 DIAGNOSIS — E871 Hypo-osmolality and hyponatremia: Secondary | ICD-10-CM

## 2019-02-04 MED ORDER — GABAPENTIN 300 MG PO CAPS: 300.0000 mg | ORAL_CAPSULE | Freq: Three times a day (TID) | ORAL | 1 refills | Status: AC

## 2019-02-04 MED ORDER — LIDOCAINE 5 % EX PTCH: 1.0000 | MEDICATED_PATCH | CUTANEOUS | 0 refills | Status: AC

## 2019-02-04 MED ORDER — VITAMIN B-1 100 MG PO TABS: 100.0000 mg | ORAL_TABLET | Freq: Every day | ORAL | 1 refills | Status: AC

## 2019-02-04 MED ORDER — FOLIC ACID 1 MG PO TABS: 1.0000 mg | ORAL_TABLET | Freq: Every day | ORAL | 1 refills | Status: AC

## 2019-02-04 MED ORDER — CYCLOBENZAPRINE HCL 10 MG PO TABS: 10.0000 mg | ORAL_TABLET | Freq: Three times a day (TID) | ORAL | 0 refills | Status: AC | PRN

## 2019-02-04 MED ORDER — LIDOCAINE 5 % EX PTCH: 1.0000 | MEDICATED_PATCH | CUTANEOUS | 0 refills | Status: DC

## 2019-02-04 MED ORDER — PANTOPRAZOLE SODIUM 40 MG PO TBEC: 40.0000 mg | DELAYED_RELEASE_TABLET | Freq: Every day | ORAL | 3 refills | Status: DC

## 2019-02-04 MED FILL — LIDOCAINE 5 % PTCH: 5 | 30 days supply | Qty: 30 | Fill #0

## 2019-02-04 MED FILL — PANTOPRAZOLE SOD DR 40 MG T: 40 | 30 days supply | Qty: 30 | Fill #0

## 2019-02-04 MED FILL — FOLIC ACID 1 MG TABS: 1 | 30 days supply | Qty: 30 | Fill #0

## 2019-02-04 MED FILL — CYCLOBENZAPRINE 10 MG TAB: 10 | 30 days supply | Qty: 90 | Fill #0

## 2019-02-04 MED FILL — GABAPENTIN 300 MG CAPSULE: 300 | 30 days supply | Qty: 90 | Fill #0

## 2019-02-04 NOTE — Assessment & Plan Note (Signed)
Protonix is refilled

## 2019-02-04 NOTE — Assessment & Plan Note (Signed)
Follow-up CBC ?

## 2019-02-04 NOTE — Assessment & Plan Note (Signed)
Weight loss secondary to lack of access to healthy foods

## 2019-02-04 NOTE — Assessment & Plan Note (Signed)
Lumbar back pain with radiculopathy with nerve root impingement affecting right lower extremity and associated lumbar foraminal stenosis  Will prescribe Lidoderm patch and gabapentin along with Flexeril and refer back to orthopedics for review of recent MRI

## 2019-02-04 NOTE — Assessment & Plan Note (Signed)
Alcoholism with significant toxic stress and history of alcohol withdrawal  Severe weight loss and active liver disease secondary to alcohol use  Some degree of confusional state may be related to alcohol  Have connected the patient with our RN case manager and licensed clinical social worker today for alcohol substance use therapy  Begin thiamine 354 mg daily and folic acid 1 mg daily  Refill Protonix 40 mg daily

## 2019-02-04 NOTE — Patient Instructions (Addendum)
Labs today include a metabolic panel blood count amylase magnesium  Begin Protonix 40 mg daily take an empty stomach and then eat 30 minutes later  Use gabapentin 1 ,  3 times daily for pain  Lidocaine patch was prescribed apply this to your right hip area daily  Begin vitamins thiamine and folic acid for your liver  Begin cyclobenzaprine 3 times daily for muscle spasm  Our licensed clinical social worker will connect with you regarding alcohol treatment  We will connect with your orthopedic physician to get an appointment with him ASAP with your MRI result  Return to Dr. Joya Gaskins in 1 month

## 2019-02-04 NOTE — Assessment & Plan Note (Signed)
Check liver function panel

## 2019-02-04 NOTE — Assessment & Plan Note (Signed)
Plan to refill the Protonix at 40 mg daily

## 2019-02-04 NOTE — Assessment & Plan Note (Signed)
History of hyponatremia we will follow-up repeat complete metabolic panel

## 2019-02-05 LAB — COMPREHENSIVE METABOLIC PANEL
ALT: 56 IU/L — ABNORMAL HIGH (ref 0–44)
AST: 169 IU/L — ABNORMAL HIGH (ref 0–40)
Albumin/Globulin Ratio: 1.5 (ref 1.2–2.2)
Albumin: 4.3 g/dL (ref 4.0–5.0)
Alkaline Phosphatase: 97 IU/L (ref 39–117)
BUN/Creatinine Ratio: 11 (ref 9–20)
BUN: 6 mg/dL (ref 6–24)
Bilirubin Total: <0.2 mg/dL (ref 0.0–1.2)
CO2: 23 mmol/L (ref 20–29)
Calcium: 9.1 mg/dL (ref 8.7–10.2)
Chloride: 98 mmol/L (ref 96–106)
Creatinine, Ser: 0.56 mg/dL — ABNORMAL LOW (ref 0.76–1.27)
GFR calc Af Amer: 144 mL/min/{1.73_m2} (ref >59)
GFR calc non Af Amer: 125 mL/min/{1.73_m2} (ref >59)
Globulin, Total: 2.8 g/dL (ref 1.5–4.5)
Glucose: 76 mg/dL (ref 65–99)
Potassium: 4.2 mmol/L (ref 3.5–5.2)
Sodium: 139 mmol/L (ref 134–144)
Total Protein: 7.1 g/dL (ref 6.0–8.5)

## 2019-02-05 LAB — CBC WITH DIFFERENTIAL/PLATELET
Basophils Absolute: 0 10*3/uL (ref 0.0–0.2)
Basos: 1 %
EOS (ABSOLUTE): 0.1 10*3/uL (ref 0.0–0.4)
Eos: 1 %
Hematocrit: 40.9 % (ref 37.5–51.0)
Hemoglobin: 13.4 g/dL (ref 13.0–17.7)
Immature Grans (Abs): 0 10*3/uL (ref 0.0–0.1)
Immature Granulocytes: 0 %
Lymphocytes Absolute: 1.1 10*3/uL (ref 0.7–3.1)
Lymphs: 27 %
MCH: 33.1 pg — ABNORMAL HIGH (ref 26.6–33.0)
MCHC: 32.8 g/dL (ref 31.5–35.7)
MCV: 101 fL — ABNORMAL HIGH (ref 79–97)
Monocytes Absolute: 0.5 10*3/uL (ref 0.1–0.9)
Monocytes: 12 %
Neutrophils Absolute: 2.4 10*3/uL (ref 1.4–7.0)
Neutrophils: 59 %
Platelets: 128 10*3/uL — ABNORMAL LOW (ref 150–450)
RBC: 4.05 x10E6/uL — ABNORMAL LOW (ref 4.14–5.80)
RDW: 13.6 % (ref 11.6–15.4)
WBC: 4.1 10*3/uL (ref 3.4–10.8)

## 2019-02-05 LAB — AMYLASE: Amylase: 252 U/L — ABNORMAL HIGH (ref 31–110)

## 2019-02-05 LAB — MAGNESIUM: Magnesium: 2.2 mg/dL (ref 1.6–2.3)

## 2019-02-06 ENCOUNTER — Other Ambulatory Visit: Payer: Self-pay

## 2019-02-06 NOTE — BH Specialist Note (Signed)
Integrated Behavioral Health Initial Visit  MRN: [id number] Name: James Joyce  Number of [place] Clinician visits:: 1/6 Session Start time: 10:05 AM  Session End time: 10:40 AM Total time: 35 minutes  Type of Service: [place] Interpretor:No. Interpretor Name and Language: NA   Warm Hand Off Completed.       SUBJECTIVE: James Joyce is a 43 y.o. male accompanied by self Patient was referred by Dr. Joya Gaskins for substance use. Patient reports the following symptoms/concerns: Pt reports active substance use (alcohol) He drinks 6-7 beers daily and is interested in detox Duration of problem: "years"; Severity of problem: severe  OBJECTIVE: Mood: Anxious and Depressed and Affect: Tearful Risk of harm to self or others: No plan to harm self or others  LIFE CONTEXT: Family and Social: Pt receives limited support. He is divorced and fighting for custody for child School/Work: Pt is unemployed. Mother in [place] sends him money Self-Care: Pt drinks alcohol daily to cope with stressors Life Changes: Pt reports substance use disorder and is interested in substance use treatment  GOALS ADDRESSED: Patient will: 1. Reduce symptoms of: anxiety, depression and alcoholism 2. Increase knowledge and/or ability of: coping skills and healthy habits  3. Demonstrate ability to: Increase adequate support systems for patient/family and Decrease self-medicating behaviors  INTERVENTIONS: Interventions utilized: Solution-Focused Strategies and Link to [employer]  Standardized Assessments completed: GAD-7 and PHQ 2&9  ASSESSMENT: Patient currently experiencing anxiety and depression triggered by ongoing substance use (alcohol). Pt drinks 6-7 beers daily and complains of withdrawal symptoms. He is interested in treatment.   LCSW educated pt on co-occurring disorders and cycle of substance use. Benefits of medical detox were discussed. Pt was  provided with listing of hospitals that will provide medical detox and inpatient treatment.   PLAN: 1. Follow up with behavioral health clinician on : Schedule follow up appointment  2. Behavioral recommendations: LCSW recommends pt initiate medical detox by utilizing resources provided. 3. Referral(s): [facility] and Substance Abuse Program 4. "From scale of 1-10, how likely are you to follow plan?": 8  Rebekah Chesterfield, LCSW 02/06/2019 11:18 AM

## 2019-02-10 ENCOUNTER — Telehealth: Payer: Self-pay

## 2019-02-10 NOTE — Telephone Encounter (Signed)
Contacted pt to go over lab results pt didn't answer lvm asking pt to give me a call at his earliest convenience  

## 2019-02-18 ENCOUNTER — Other Ambulatory Visit: Payer: Self-pay

## 2019-02-24 ENCOUNTER — Ambulatory Visit: Payer: Self-pay | Admitting: Orthopaedic Surgery

## 2019-02-27 ENCOUNTER — Ambulatory Visit: Payer: Self-pay | Admitting: Neurology

## 2019-03-07 ENCOUNTER — Other Ambulatory Visit: Payer: Self-pay

## 2019-03-07 ENCOUNTER — Emergency Department (HOSPITAL_COMMUNITY)
Admission: EM | Admit: 2019-03-07 | Discharge: 2019-03-08 | Disposition: A | Discharge status: Home / Self Care | Payer: Self-pay | Attending: Emergency Medicine | Admitting: Emergency Medicine

## 2019-03-07 ENCOUNTER — Encounter (HOSPITAL_COMMUNITY): Payer: Self-pay | Admitting: Emergency Medicine

## 2019-03-07 DIAGNOSIS — K2100 Gastro-esophageal reflux disease with esophagitis, without bleeding: Secondary | ICD-10-CM

## 2019-03-07 DIAGNOSIS — R7401 Elevation of levels of liver transaminase levels: Secondary | ICD-10-CM

## 2019-03-07 DIAGNOSIS — F10920 Alcohol use, unspecified with intoxication, uncomplicated: Secondary | ICD-10-CM

## 2019-03-07 DIAGNOSIS — Z79899 Other long term (current) drug therapy: Secondary | ICD-10-CM | POA: Insufficient documentation

## 2019-03-07 DIAGNOSIS — R066 Hiccough: Secondary | ICD-10-CM

## 2019-03-07 DIAGNOSIS — R740 Nonspecific elevation of levels of transaminase and lactic acid dehydrogenase [LDH]: Secondary | ICD-10-CM | POA: Insufficient documentation

## 2019-03-07 LAB — COMPREHENSIVE METABOLIC PANEL
ALT: 168 U/L — ABNORMAL HIGH (ref 0–44)
AST: 467 U/L — ABNORMAL HIGH (ref 15–41)
Albumin: 3.3 g/dL — ABNORMAL LOW (ref 3.5–5.0)
Alkaline Phosphatase: 107 U/L (ref 38–126)
Anion gap: 13 (ref 5–15)
BUN: 7 mg/dL (ref 6–20)
CO2: 24 mmol/L (ref 22–32)
Calcium: 8.4 mg/dL — ABNORMAL LOW (ref 8.9–10.3)
Chloride: 97 mmol/L — ABNORMAL LOW (ref 98–111)
Creatinine, Ser: 0.77 mg/dL (ref 0.61–1.24)
GFR calc Af Amer: >60 mL/min (ref >60)
GFR calc non Af Amer: >60 mL/min (ref >60)
Glucose, Bld: 92 mg/dL (ref 70–99)
Potassium: 3.9 mmol/L (ref 3.5–5.1)
Sodium: 134 mmol/L — ABNORMAL LOW (ref 135–145)
Total Bilirubin: 0.8 mg/dL (ref 0.3–1.2)
Total Protein: 7.2 g/dL (ref 6.5–8.1)

## 2019-03-07 LAB — URINALYSIS, ROUTINE W REFLEX MICROSCOPIC
Bilirubin Urine: NEGATIVE
Glucose, UA: NEGATIVE mg/dL
Hgb urine dipstick: NEGATIVE
Ketones, ur: NEGATIVE mg/dL
Leukocytes,Ua: NEGATIVE
Nitrite: NEGATIVE
Protein, ur: NEGATIVE mg/dL
Specific Gravity, Urine: 1.002 — ABNORMAL LOW (ref 1.005–1.030)
pH: 7 (ref 5.0–8.0)

## 2019-03-07 LAB — CBC WITH DIFFERENTIAL/PLATELET
Abs Immature Granulocytes: 0 10*3/uL (ref 0.00–0.07)
Basophils Absolute: 0 10*3/uL (ref 0.0–0.1)
Basophils Relative: 1 %
Eosinophils Absolute: 0 10*3/uL (ref 0.0–0.5)
Eosinophils Relative: 1 %
HCT: 39 % (ref 39.0–52.0)
Hemoglobin: 14 g/dL (ref 13.0–17.0)
Immature Granulocytes: 0 %
Lymphocytes Relative: 60 %
Lymphs Abs: 2 10*3/uL (ref 0.7–4.0)
MCH: 34.2 pg — ABNORMAL HIGH (ref 26.0–34.0)
MCHC: 35.9 g/dL (ref 30.0–36.0)
MCV: 95.4 fL (ref 80.0–100.0)
Monocytes Absolute: 0.3 10*3/uL (ref 0.1–1.0)
Monocytes Relative: 8 %
Neutro Abs: 1 10*3/uL — ABNORMAL LOW (ref 1.7–7.7)
Neutrophils Relative %: 30 %
Platelets: 147 10*3/uL — ABNORMAL LOW (ref 150–400)
RBC: 4.09 MIL/uL — ABNORMAL LOW (ref 4.22–5.81)
RDW: 11.8 % (ref 11.5–15.5)
WBC: 3.3 10*3/uL — ABNORMAL LOW (ref 4.0–10.5)
nRBC: 0 % (ref 0.0–0.2)

## 2019-03-07 LAB — ETHANOL: Alcohol, Ethyl (B): 310 mg/dL (ref <10)

## 2019-03-07 LAB — LIPASE, BLOOD: Lipase: 40 U/L (ref 11–51)

## 2019-03-07 NOTE — ED Triage Notes (Signed)
Patient reports worsening gastric reflux with persistent hiccups and intermittent bloody stools onset this week , ETOH today ( 3 beers) . No fever or chills .

## 2019-03-08 LAB — POC OCCULT BLOOD, ED: Fecal Occult Bld: NEGATIVE

## 2019-03-08 MED ORDER — CHLORPROMAZINE HCL 25 MG PO TABS
25.0000 mg | ORAL_TABLET | Freq: Once | ORAL | Status: AC
  Administered 2019-03-08: 01:00:00 25 mg via ORAL
  Filled 2019-03-08: qty 1

## 2019-03-08 MED ORDER — LIDOCAINE VISCOUS HCL 2 % MT SOLN
15.0000 mL | Freq: Once | OROMUCOSAL | Status: AC
  Administered 2019-03-08: 15 mL via ORAL
  Filled 2019-03-08: qty 15

## 2019-03-08 MED ORDER — PANTOPRAZOLE SODIUM 40 MG PO TBEC: 40.0000 mg | DELAYED_RELEASE_TABLET | Freq: Every day | ORAL | 0 refills | Status: DC

## 2019-03-08 MED ORDER — ALUM & MAG HYDROXIDE-SIMETH 200-200-20 MG/5ML PO SUSP
30.0000 mL | Freq: Once | ORAL | Status: AC
  Administered 2019-03-08: 30 mL via ORAL
  Filled 2019-03-08: qty 30

## 2019-03-08 MED ORDER — PANTOPRAZOLE SODIUM 40 MG PO TBEC: 40.0000 mg | DELAYED_RELEASE_TABLET | Freq: Every day | ORAL | 0 refills | Status: AC

## 2019-03-08 MED ORDER — PANTOPRAZOLE SODIUM 40 MG PO TBEC
40.0000 mg | DELAYED_RELEASE_TABLET | Freq: Once | ORAL | Status: AC
  Administered 2019-03-08: 01:00:00 40 mg via ORAL
  Filled 2019-03-08: qty 1

## 2019-03-08 MED ORDER — METOCLOPRAMIDE HCL 10 MG PO TABS: 10.0000 mg | ORAL_TABLET | Freq: Four times a day (QID) | ORAL | 0 refills | Status: DC | PRN

## 2019-03-08 MED ORDER — METOCLOPRAMIDE HCL 10 MG PO TABS: 10.0000 mg | ORAL_TABLET | Freq: Four times a day (QID) | ORAL | 0 refills | Status: AC | PRN

## 2019-03-08 MED ORDER — METOCLOPRAMIDE HCL 10 MG PO TABS
10.0000 mg | ORAL_TABLET | Freq: Once | ORAL | Status: AC
  Administered 2019-03-08: 10 mg via ORAL
  Filled 2019-03-08: qty 1

## 2019-03-08 NOTE — ED Notes (Signed)
E-signature not available, verbalized understanding of DC instructions and Rx 

## 2019-03-08 NOTE — ED Provider Notes (Signed)
[name]   CSN: [id number] Arrival date & time: 03/07/19  2122    History   Chief Complaint Chief Complaint  Patient presents with  . Gastroesophageal Reflux    Hiccups  . Bloody Stools    HPI James Joyce is a 43 y.o. male.   The history is provided by the patient.  He has history of GERD, alcohol abuse and comes in with several complaints.  He has been having hiccups for the last week.  During that same time, he has noted blood in his stool.  Blood is actually only present when he wipes.  He states it is bad but cannot specify as it is bright red or dark red.  He denies abdominal pain.  He denies nausea or vomiting.  He does admit to heavy alcohol abuse and admits to drinking 120 ounces of beer today.  He states that he was in a detox facility and they did not renew his prescription for pantoprazole, so he has not taken it for the last 2 weeks.  He denies tobacco use.  History reviewed. No pertinent past medical history.  Patient Active Problem List   Diagnosis Date Noted  . Lumbar foraminal stenosis 02/04/2019  . Homelessness 02/04/2019  . Lumbar back pain with radiculopathy affecting right lower extremity 02/04/2019  . Alcohol withdrawal (HCC) 01/14/2019  . Seizure-like activity (HCC) 01/14/2019  . Thrombocytopenia (HCC) 01/14/2019  . Hyponatremia 01/14/2019  . Transaminasemia 01/14/2019  . Alcoholism (HCC) 01/14/2019  . Prolonged QT interval 01/14/2019  . ABSCESS OF ANAL AND RECTAL REGIONS 12/30/2007  . WEIGHT LOSS 12/30/2007  . EXTERNAL HEMORRHOIDS 12/29/2007  . Gastritis and duodenitis 12/29/2007  . GERD without esophagitis 12/29/2007  . COLONIC POLYPS, HYPERPLASTIC, HX OF 12/29/2007    History reviewed. No pertinent surgical history.      Home Medications    Prior to Admission medications   Medication Sig Start Date End Date Taking? Authorizing Provider  cyclobenzaprine (FLEXERIL) 10 MG tablet Take  1 tablet (10 mg total) by mouth 3 (three) times daily as needed for muscle spasms. 02/04/19   Storm FriskWright, Patrick E, MD  folic acid (FOLVITE) 1 MG tablet Take 1 tablet (1 mg total) by mouth daily. 02/04/19   Storm FriskWright, Patrick E, MD  gabapentin (NEURONTIN) 300 MG capsule Take 1 capsule (300 mg total) by mouth 3 (three) times daily. 02/04/19   Storm FriskWright, Patrick E, MD  lidocaine (LIDODERM) 5 % Place 1 patch onto the skin daily. Remove & Discard patch within 12 hours or as directed by MD 02/04/19   Storm FriskWright, Patrick E, MD  pantoprazole (PROTONIX) 40 MG tablet Take 1 tablet (40 mg total) by mouth daily. 02/04/19   Storm FriskWright, Patrick E, MD  thiamine (VITAMIN B-1) 100 MG tablet Take 1 tablet (100 mg total) by mouth daily. 02/04/19   Storm FriskWright, Patrick E, MD    Family History No family history on file.  Social History Social History   Tobacco Use  . Smoking status: Never Smoker  . Smokeless tobacco: Never Used  Substance Use Topics  . Alcohol use: Yes    Comment: 64oz beers x4  . Drug use: No     Allergies   Patient has no known allergies.   Review of Systems Review of Systems  All other systems reviewed and are negative.    Physical Exam Updated Vital Signs BP 121/90 (BP Location: Right Arm)   Pulse 89   Temp 98.2 F (36.8 C)  Resp 20   SpO2 100%   Physical Exam Vitals signs and nursing note reviewed.    43 year old male, resting comfortably and in no acute distress. Vital signs are normal. Oxygen saturation is 100%, which is normal. Head is normocephalic and atraumatic. PERRLA, EOMI. Oropharynx is clear. Neck is nontender and supple without adenopathy or JVD. Back is nontender and there is no CVA tenderness. Lungs are clear without rales, wheezes, or rhonchi. Chest is nontender. Heart has regular rate and rhythm without murmur. Abdomen is soft, flat, nontender without masses or hepatosplenomegaly and peristalsis is normoactive. Rectal: Normal sphincter tone.  No hemorrhoids seen or palpated.   No masses.  No stool present in the rectal ampulla. Extremities have no cyanosis or edema, full range of motion is present. Skin is warm and dry without rash. Neurologic: Mental status is normal, cranial nerves are intact, there are no motor or sensory deficits.  ED Treatments / Results  Labs (all labs ordered are listed, but only abnormal results are displayed) Labs Reviewed  ETHANOL - Abnormal; Notable for the following components:      Result Value   Alcohol, Ethyl (B) 310 (*)    All other components within normal limits  CBC WITH DIFFERENTIAL/PLATELET - Abnormal; Notable for the following components:   WBC 3.3 (*)    RBC 4.09 (*)    MCH 34.2 (*)    Platelets 147 (*)    Neutro Abs 1.0 (*)    All other components within normal limits  COMPREHENSIVE METABOLIC PANEL - Abnormal; Notable for the following components:   Sodium 134 (*)    Chloride 97 (*)    Calcium 8.4 (*)    Albumin 3.3 (*)    AST 467 (*)    ALT 168 (*)    All other components within normal limits  URINALYSIS, ROUTINE W REFLEX MICROSCOPIC - Abnormal; Notable for the following components:   Color, Urine STRAW (*)    Specific Gravity, Urine 1.002 (*)    All other components within normal limits  LIPASE, BLOOD    EKG None  Procedures Procedures   Medications Ordered in ED Medications  alum & mag hydroxide-simeth (MAALOX/MYLANTA) 200-200-20 MG/5ML suspension 30 mL (30 mLs Oral Given 03/08/19 0053)    And  lidocaine (XYLOCAINE) 2 % viscous mouth solution 15 mL (15 mLs Oral Given 03/08/19 0053)  pantoprazole (PROTONIX) EC tablet 40 mg (40 mg Oral Given 03/08/19 0052)  chlorproMAZINE (THORAZINE) tablet 25 mg (25 mg Oral Given 03/08/19 0052)  metoCLOPramide (REGLAN) tablet 10 mg (10 mg Oral Given 03/08/19 0243)     Initial Impression / Assessment and Plan / ED Course  I have reviewed the triage vital signs and the nursing notes.  Pertinent labs & imaging results that were available during my care of the patient were  reviewed by me and considered in my medical decision making (see chart for details).  Hiccups.  Rectal bleeding without obvious cause on exam.  Acid reflux secondary to alcohol abuse and discontinuing proton pump inhibitor.  Labs are significant for elevated ethanol level and transaminases.  Hemoglobin is normal.  Old records are reviewed, and transaminases are significantly higher than they were 1 month ago, probably secondary to alcohol abuse.  Hemoglobin is actually higher than it was 1 month ago.  Along with normal blood pressure and heart rate, this argues against significant blood loss.  He will be given a GI cocktail and a dose of pantoprazole.  Will be given chlorpromazine  for his hiccups.  His abdominal pain was improved with a GI cocktail and pantoprazole, but hiccups were not improved with chlorpromazine.  He is given a dose of metoclopramide, with excellent relief of his hiccups.  He is discharged with prescriptions for pantoprazole and metoclopramide, encouraged to abstain from alcohol.  Final Clinical Impressions(s) / ED Diagnoses   Final diagnoses:  GERD with esophagitis  Singultus  Alcohol intoxication, uncomplicated (HCC)  Elevated transaminase level    ED Discharge Orders         Ordered    pantoprazole (PROTONIX) 40 MG tablet  Daily     03/08/19 0402    metoCLOPramide (REGLAN) 10 MG tablet  Every 6 hours PRN     03/08/19 0402           Dione Booze, MD 03/08/19 0405

## 2019-03-08 NOTE — Discharge Instructions (Addendum)
Take antacids (like Maalox, Mylanta, Tums, Pepto Bismol) as needed.  Do not drink anything with alcohol in it - it will make your stomach problems worse.

## 2019-03-09 NOTE — Progress Notes (Deleted)
Subjective:    Patient ID: James Joyce, male    DOB: 06/19/1976, 43 y.o.   MRN: [id number]  This is a 43 year old black male originally from [place].  The patient has history of alcoholism and was admitted in July between the 14th and 16th for severe weakness confusion alcohol withdrawal.  Work-up showed hepatic steatosis and increased liver function tests.  Urine drug screens positive for benzodiazepines.  The patient was discharged.  The patient now is living in a motel and is homeless at this time.  The patient's diet is not good.  He does have daily gastritis type symptoms.  History of reflux is noted.  Another issue is that of severe right hip pain with right leg weakness and severe pain in both lower extremities.  He has been to orthopedics recently and work-up there has shown significant lumbar stenosis with also nerve impingement syndrome right worse than left in the lumbar spine  The patient has a pending follow-up with orthopedics on August 24 that he was not aware of  The patient drinks about 6-8 beers every day.  Again his diet is quite poor.  Below is the discharge summary from the July admission  Admit date: 01/13/2019 Discharge date: 01/15/2019  Admitted From: Home Disposition: Home  Recommendations for Outpatient Follow-up:  1. Follow up with PCP in 1 week with repeat CBC/CMP 2. Outpatient follow-up with orthopedics for right knee/hip pain 3. Might benefit from outpatient neurology evaluation 4. Abstain from alcohol 5. Follow up in ED if symptoms worsen or new appear   Home Health: No Equipment/Devices: None  Discharge Condition: Guarded CODE STATUS: Full Diet recommendation: Heart healthy  Brief/Interim Summary: 43 year old male with history of alcoholism presented on 01/13/2019 when he was found on ground shaking, confused.  Noncontrast CT head was negative for acute abnormality.  CT cervical spine and maxillofacial was negative for acute fractures.  Right upper  quadrant ultrasound was consistent with hepatic steatosis.  He had elevated LFTs on presentation.  He was admitted for alcohol withdrawal and started on IV fluids and CIWA protocol.  Urine drug screen was positive for benzodiazepines.  His LFTs are improving.  PT recommended home health PT.  No further seizure-like activity noted during the hospitalization.  He will be discharged.  He will need close follow-up with PCP.  Might need neurology evaluation as an outpatient if he has seizure-like activities.  Discharge Diagnoses:   Alcohol withdrawal with probable seizure, most likely alcohol withdrawal seizure History of alcohol abuse -Presented with questionable seizure-like activity.  No seizure activity since admission.  Has not received any Ativan since yesterday morning.  Currently on CIWA protocol.  Continue multivitamin, thiamine and folate upon discharge.  Social worker consult to provide outpatient resources for alcohol abuse. -He is medically stable for discharge.  PT recommended home health PT. -CT of the brain and cervical spine was negative for acute abnormality on presentation  Acute alcoholic hepatitis  -LFTs improving.  Right upper quadrant ultrasound showed hepatic steatosis.  Outpatient follow-up.  Hyponatremia and hypokalemia  -Improved.  Right hip and knee pain -Apparently he has ongoing symptoms for a while now.  X-rays of the hip and knee unremarkable.  Recommend outpatient orthopedic evaluation.  Also note the patient went to the emergency room on August 2 just this past weekend documentation from that visit is as below and an MRI was done during that visit which showed significant changes.  Note the patient has a follow-up visit with orthopedics to review  this MRI already scheduled  43 year old male appears otherwise well presents for evaluation of back pain.  Afebrile, nonseptic, non-ill-appearing.  Followed outpatient orthopedics by Dr. Sherrian Divers.  Worsening right lower  back pain.  He is able to ambulate however with pain.  Patient states he did have episode of bowel and bladder incontinence earlier today.  States "I was not able to hold it."  Positive straight leg raise on right at 45 degrees.  Tenderness palpation over his right piriformis and SI joint.  Heart and lungs clear.  Normal reflexes.  No tenderness over the right hip to suggest a septic bursitis.  No overlying skin changes to indicate infectious process.  Given episode of incontinence obtain MRI to rule out cauda equina or other additional processes.  Question acute on chronic back pain.  Patient does have history of chronic alcohol abuse however does not appear to be in withdrawal at this time.  No tachycardia, tachypnea or hypoxia.   Ambulatory in ED without difficulty.  Imaging personally reviewed.  Labs without acute findings MRI with 1. No acute abnormality of the lumbar spine. 2. Small L2-3 disc bulge mildly narrowing the lateral recesses without causing spinal canal or neural foraminal stenosis. 3. Small L4-5 left foraminal disc protrusion in close proximity to the exiting left L4 nerve root. Correlate for corresponding radiculopathy.  No evidence of cauda equina, discitis, osteomyelitis infectious process.  Unsure of cause of patient's prior episode of bowel or bladder incontinence however his abdomen soft, nontender without rebound or guarding.  Likely patient's acute on chronic pain.  MRI findings correlate with his right lower pain.  Given symptoms have been onset since November we will have him follow-up with orthopedics he currently follows with.  Will DC home with lidocaine patches.  Patient with back pain.  No neurological deficits and normal neuro exam.  Patient can walk but states is painful.  No loss of bowel or bladder control.  No concern for cauda equina.  No fever, night sweats, weight loss, h/o cancer, IVDU.  RICE protocol and pain medicine indicated and discussed with patient.   9/8  At last OV: coholism [facility]) Alcoholism with significant toxic stress and history of alcohol withdrawal  Severe weight loss and active liver disease secondary to alcohol use  Some degree of confusional state may be related to alcohol  Have connected the patient with our RN case manager and licensed clinical social worker today for alcohol substance use therapy  Begin thiamine 563 mg daily and folic acid 1 mg daily  Refill Protonix 40 mg daily    Gastritis and duodenitis Plan to refill the Protonix at 40 mg daily  GERD without esophagitis Protonix is refilled  Hyponatremia History of hyponatremia we will follow-up repeat complete metabolic panel  Thrombocytopenia (HCC) Follow-up CBC  Transaminasemia Check liver function panel  WEIGHT LOSS Weight loss secondary to lack of access to healthy foods  Lumbar back pain with radiculopathy affecting right lower extremity Lumbar back pain with radiculopathy with nerve root impingement affecting right lower extremity and associated lumbar foraminal stenosis  Will prescribe Lidoderm patch and gabapentin along with Flexeril and refer back to orthopedics for review of recent MRI     No past medical history on file.   No family history on file.   Social History   Socioeconomic History  . Marital status: Single    Spouse name: Not on file  . Number of children: Not on file  . Years of education: Not on file  .  Highest education level: Not on file  Occupational History  . Not on file  Social Needs  . Financial resource strain: Not on file  . Food insecurity    Worry: Not on file    Inability: Not on file  . Transportation needs    Medical: Not on file    Non-medical: Not on file  Tobacco Use  . Smoking status: Never Smoker  . Smokeless tobacco: Never Used  Substance and Sexual Activity  . Alcohol use: Yes    Comment: 64oz beers x4  . Drug use: No  . Sexual activity: Not on file  Lifestyle  . Physical activity     Days per week: Not on file    Minutes per session: Not on file  . Stress: Not on file  Relationships  . Social Musicianconnections    Talks on phone: Not on file    Gets together: Not on file    Attends religious service: Not on file    Active member of club or organization: Not on file    Attends meetings of clubs or organizations: Not on file    Relationship status: Not on file  . Intimate partner violence    Fear of current or ex partner: Not on file    Emotionally abused: Not on file    Physically abused: Not on file    Forced sexual activity: Not on file  Other Topics Concern  . Not on file  Social History Narrative  . Not on file     No Known Allergies   Outpatient Medications Prior to Visit  Medication Sig Dispense Refill  . cyclobenzaprine (FLEXERIL) 10 MG tablet Take 1 tablet (10 mg total) by mouth 3 (three) times daily as needed for muscle spasms. 90 tablet 0  . folic acid (FOLVITE) 1 MG tablet Take 1 tablet (1 mg total) by mouth daily. 60 tablet 1  . gabapentin (NEURONTIN) 300 MG capsule Take 1 capsule (300 mg total) by mouth 3 (three) times daily. 90 capsule 1  . lidocaine (LIDODERM) 5 % Place 1 patch onto the skin daily. Remove & Discard patch within 12 hours or as directed by MD 30 patch 0  . metoCLOPramide (REGLAN) 10 MG tablet Take 1 tablet (10 mg total) by mouth every 6 (six) hours as needed for nausea (or hiccups or headache). 30 tablet 0  . pantoprazole (PROTONIX) 40 MG tablet Take 1 tablet (40 mg total) by mouth daily. 30 tablet 0  . thiamine (VITAMIN B-1) 100 MG tablet Take 1 tablet (100 mg total) by mouth daily. 60 tablet 1   No facility-administered medications prior to visit.        Review of Systems  Constitutional:     weight loss, night sweats,  Fevers, chills, fatigue, lassitude. HEENT:   No headaches,  Difficulty swallowing,  Tooth/dental problems,  Sore throat,                No sneezing, itching, ear ache, nasal congestion, post nasal drip,   CV:   No chest pain,  Orthopnea, PND, swelling in lower extremities, anasarca, dizziness, palpitations  GI   heartburn, indigestion, abdominal pain, nausea, vomiting, diarrhea, change in bowel habits, loss of appetite  Resp: No shortness of breath with exertion or at rest.  No excess mucus, no productive cough,  No non-productive cough,  No coughing up of blood.  No change in color of mucus.  No wheezing.  No chest wall deformity  Skin: no rash  or lesions.  GU: no dysuria, change in color of urine, no urgency or frequency.  No flank pain.  MS:   joint pain or swelling.  No decreased range of motion.   back pain.  Psych:   change in mood or affect. ,  depression  anxiety.   memory loss.     Objective:   Physical Exam  There were no vitals filed for this visit.  Gen: slightly confused, thin, in no distress,  anxious affect  ENT: No lesions,  mouth clear,  oropharynx clear, no postnasal drip  Neck: No JVD, no TMG, no carotid bruits  Lungs: No use of accessory muscles, no dullness to percussion, clear without rales or rhonchi  Cardiovascular: RRR, heart sounds normal, no murmur or gallops, no peripheral edema  Abdomen: soft and NT, liver enlarged 2FB below RCM  ,  BS normal  Musculoskeletal: No deformities, no cyanosis or clubbing  Neuro: alert, non focal  Skin: Warm, no lesions or rashes  No results found.  02/01/2019 MRI Lumbar spine IMPRESSION: 1. No acute abnormality of the lumbar spine. 2. Small L2-3 disc bulge mildly narrowing the lateral recesses without causing spinal canal or neural foraminal stenosis. 3. Small L4-5 left foraminal disc protrusion in close proximity to the exiting left L4 nerve root. Correlate for corresponding radiculopathy.     Assessment & Plan:  I personally reviewed all images and lab data in the [facility] system as well as any outside material available during this office visit and agree with the  radiology impressions.   No problem-specific  Assessment & Plan notes found for this encounter.   There are no diagnoses linked to this encounter.

## 2019-03-10 ENCOUNTER — Ambulatory Visit: Payer: Self-pay | Admitting: Critical Care Medicine

## 2019-03-10 ENCOUNTER — Encounter: Payer: Self-pay | Admitting: Critical Care Medicine

## 2019-04-03 ENCOUNTER — Telehealth: Payer: Self-pay | Admitting: Licensed Clinical Social Worker

## 2019-04-03 NOTE — Telephone Encounter (Signed)
Return call placed to patient to provide requested treatment resources. LCSW left message for a return call.

## 2019-10-28 MED FILL — FOLIC ACID 1 MG TABS: 1 | 30 days supply | Qty: 30 | Fill #1

## 2019-10-28 MED FILL — PANTOPRAZOLE SOD DR 40 MG T: 40 | 30 days supply | Qty: 30 | Fill #1

## 2019-11-09 ENCOUNTER — Telehealth: Payer: Self-pay | Admitting: Critical Care Medicine

## 2019-11-09 NOTE — Telephone Encounter (Signed)
Patient called in and requested to speak with LCSW. Please follow up at your earliest convenience.

## 2019-11-11 ENCOUNTER — Ambulatory Visit: Payer: Self-pay

## 2020-01-07 ENCOUNTER — Other Ambulatory Visit: Payer: Self-pay

## 2020-01-07 ENCOUNTER — Telehealth: Payer: Self-pay | Admitting: Critical Care Medicine

## 2020-01-07 ENCOUNTER — Ambulatory Visit: Payer: Self-pay | Attending: Physician Assistant | Admitting: Physician Assistant

## 2020-01-07 NOTE — Telephone Encounter (Signed)
Copied from CRM [id number]. Topic: Appointment Scheduling - Scheduling Inquiry for Clinic >> Jan 07, 2020 12:50 PM Crist Infante wrote: Reason for CRM: pt wants to know why he cannot come in office for his appt?  He is very upset and would like a call back

## 2020-01-08 IMAGING — CT CT MAXILLOFACIAL WITHOUT CONTRAST
5 of 14 series · 15 of 47 positions shown, 16 images · non-contrast
Comparison: None.

CLINICAL DATA: Seizure, fall.

EXAM:
CT HEAD WITHOUT CONTRAST
CT MAXILLOFACIAL WITHOUT CONTRAST
CT CERVICAL SPINE WITHOUT CONTRAST
TECHNIQUE: Multidetector CT imaging of the head, cervical spine, and
maxillofacial structures were performed using the standard protocol
without intravenous contrast. Multiplanar CT image reconstructions
of the cervical spine and maxillofacial structures were also
generated.

[Series 5: head bone · axial · 0.44mm/px · z∈[-98,-14]mm · 3 of 85 slices shown]
[im 22/85  bone]
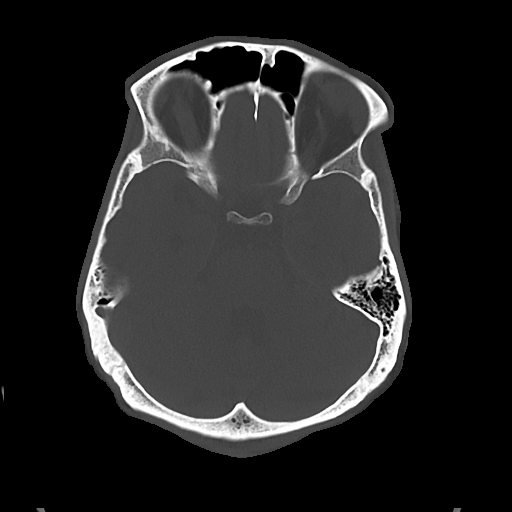
[im 43/85  bone]
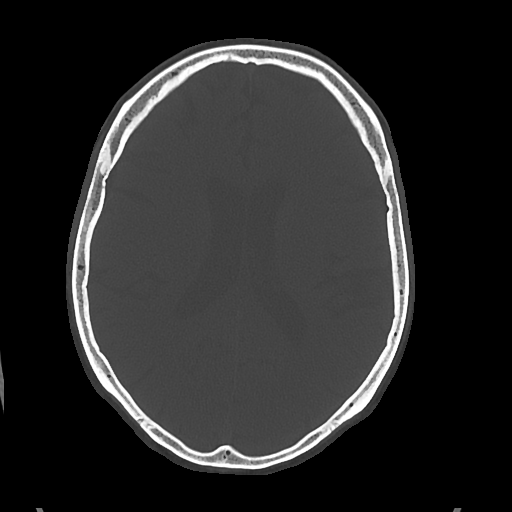
[im 64/85  bone]
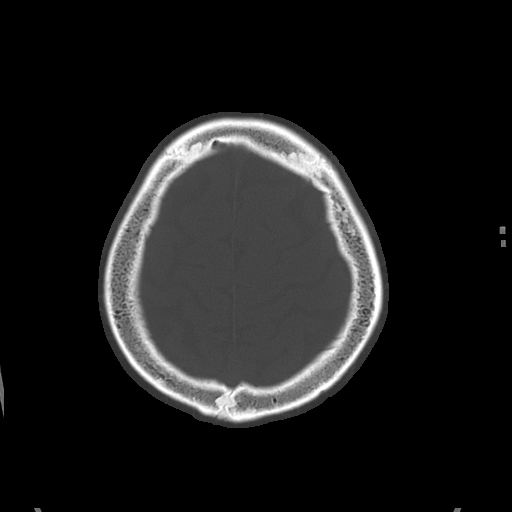

[Series 8: maxilllofacial 2.0 hr40 3 · axial · 0.39mm/px · z∈[-199,-111]mm · 3 of 88 slices shown]
[im 22/88  bone]
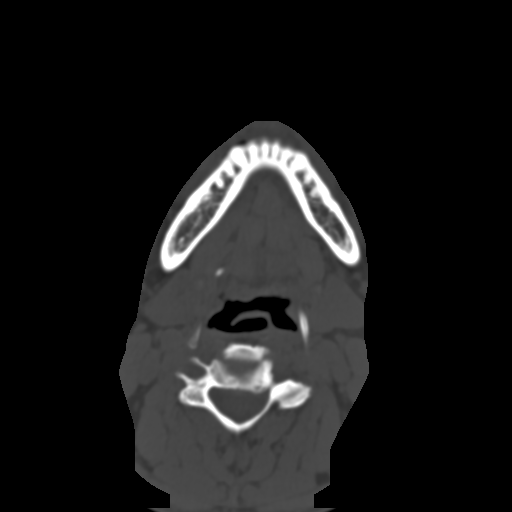
[im 44/88  bone]
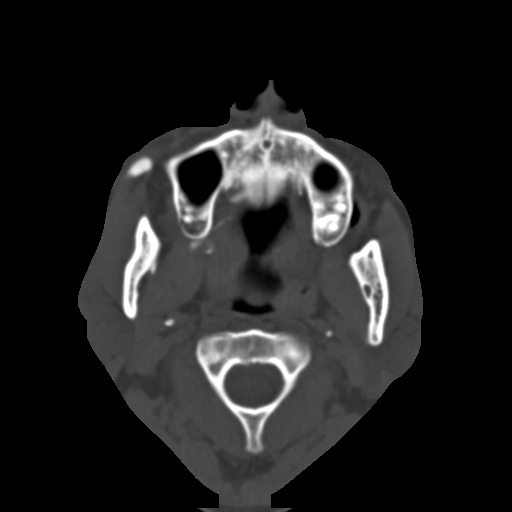
[im 66/88  bone]
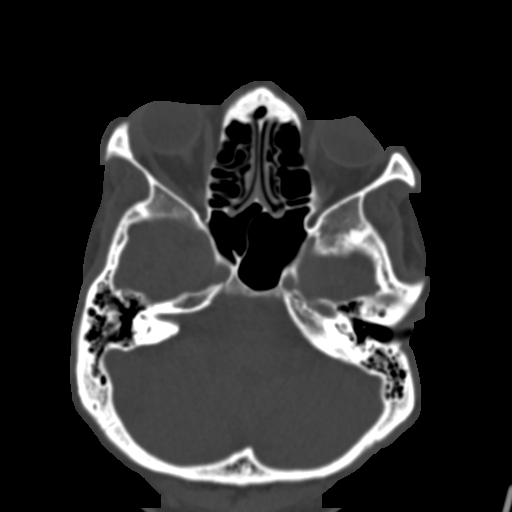

[Series 10: maxilllofacial 2.0 hr59 3 · axial · 0.39mm/px · z∈[-199,-111]mm · 3 of 88 slices shown]
[im 22/88  bone]
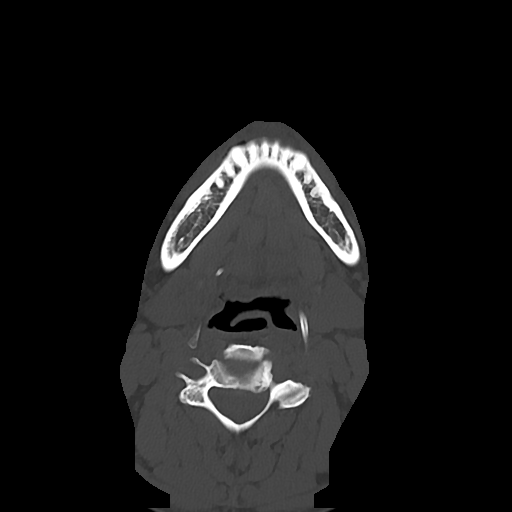
[im 44/88  bone]
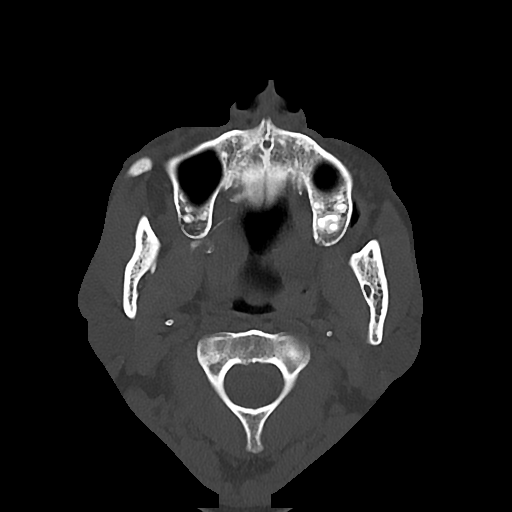
[im 66/88  bone]
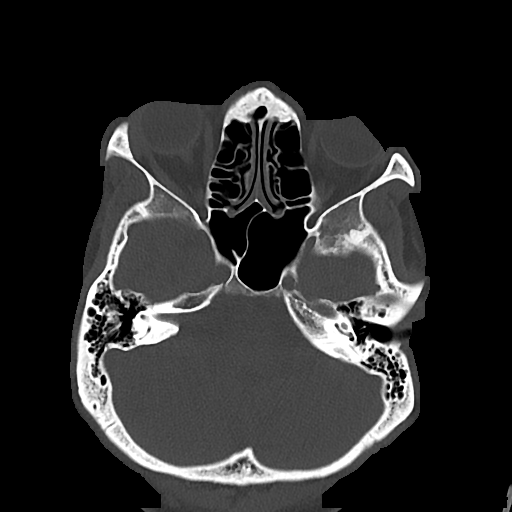

[Series 14: bone cor · coronal · 0.34mm/px · 2 of 100 slices shown]
[im 34/100  bone]
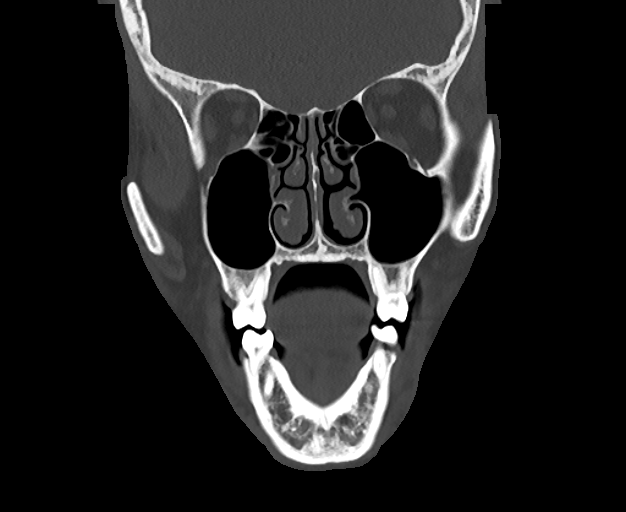
[im 67/100  bone]
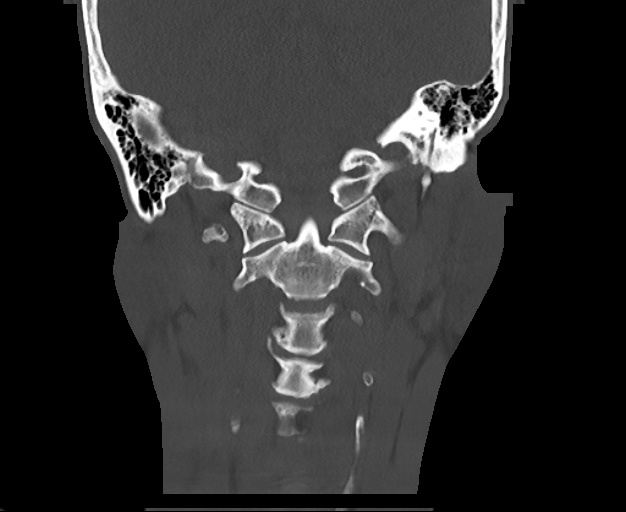

[Series 20: orthogonal axials · axial · 0.21mm/px · z∈[-264,-161]mm · 4 of 89 slices shown, 5 images]
[im 18/89  brain]
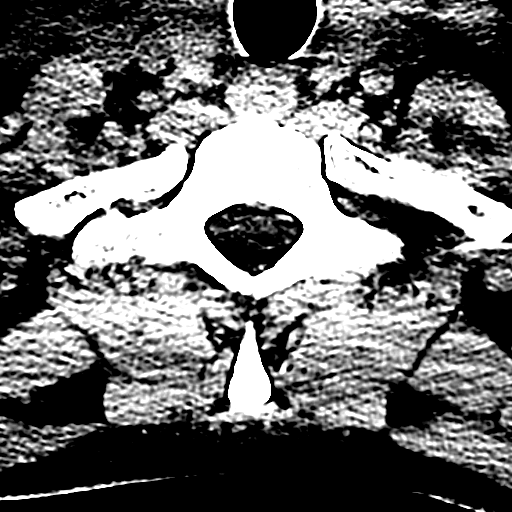
[im 18/89  bone]
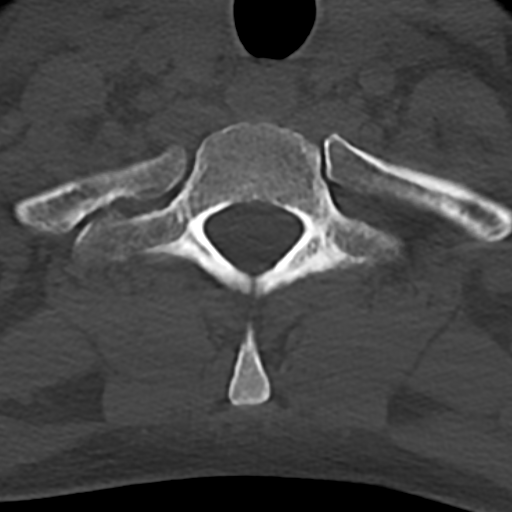
[im 36/89  bone]
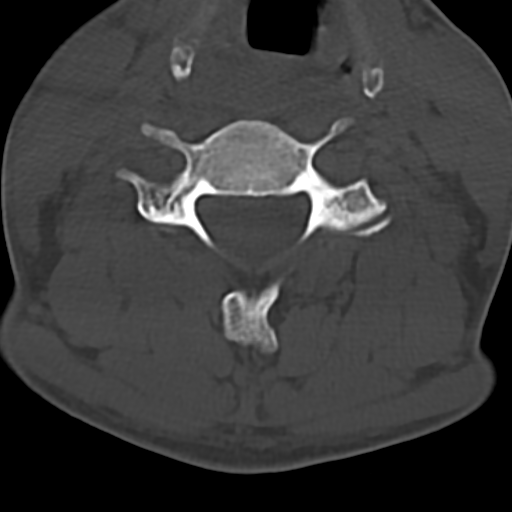
[im 53/89  bone]
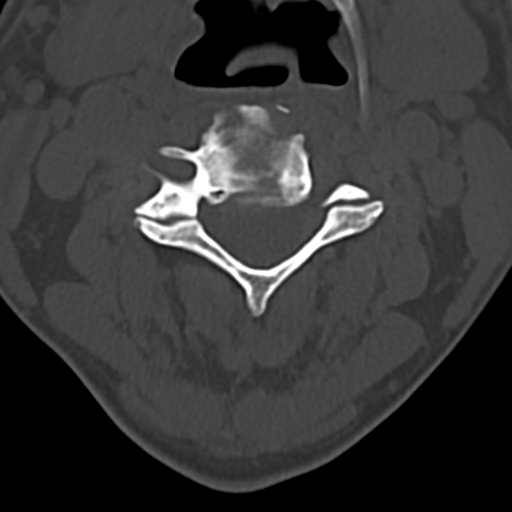
[im 71/89  bone]
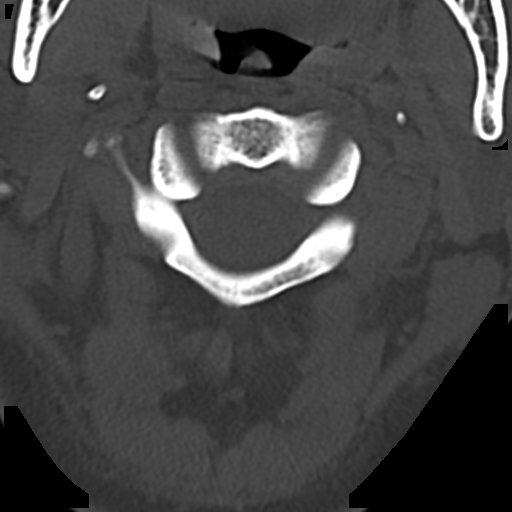

[15 of 47 positions shown; findings below may reference images not displayed]

FINDINGS: CT HEAD FINDINGS

Brain: No acute intracranial abnormality. Specifically, no
hemorrhage, hydrocephalus, mass lesion, acute infarction, or
significant intracranial injury.

Vascular: No hyperdense vessel or unexpected calcification.

Skull: No acute calvarial abnormality.

Other: None

CT MAXILLOFACIAL FINDINGS

Osseous: No fracture or mandibular dislocation. No destructive
process.

Orbits: Negative. No traumatic or inflammatory finding.

Sinuses: Clear

Soft tissues: Soft tissue swelling over the right lateral orbit and
face.

CT CERVICAL SPINE FINDINGS

Alignment: No subluxation

Skull base and vertebrae: No acute fracture. No primary bone lesion
or focal pathologic process.

Soft tissues and spinal canal: No prevertebral fluid or swelling. No
visible canal hematoma.

Disc levels:  Maintained.  Degenerative facet disease bilaterally.

Upper chest: No acute findings

Other: None
IMPRESSION: No acute intracranial abnormality.

No facial or cervical spine fracture.

## 2020-01-10 IMAGING — DX RIGHT KNEE - 1-2 VIEW
2 series · 2 of 2 positions shown · non-contrast
Comparison: None.

CLINICAL DATA: Pain without injury.

EXAM:
RIGHT KNEE - 1-2 VIEW

[knee ap]
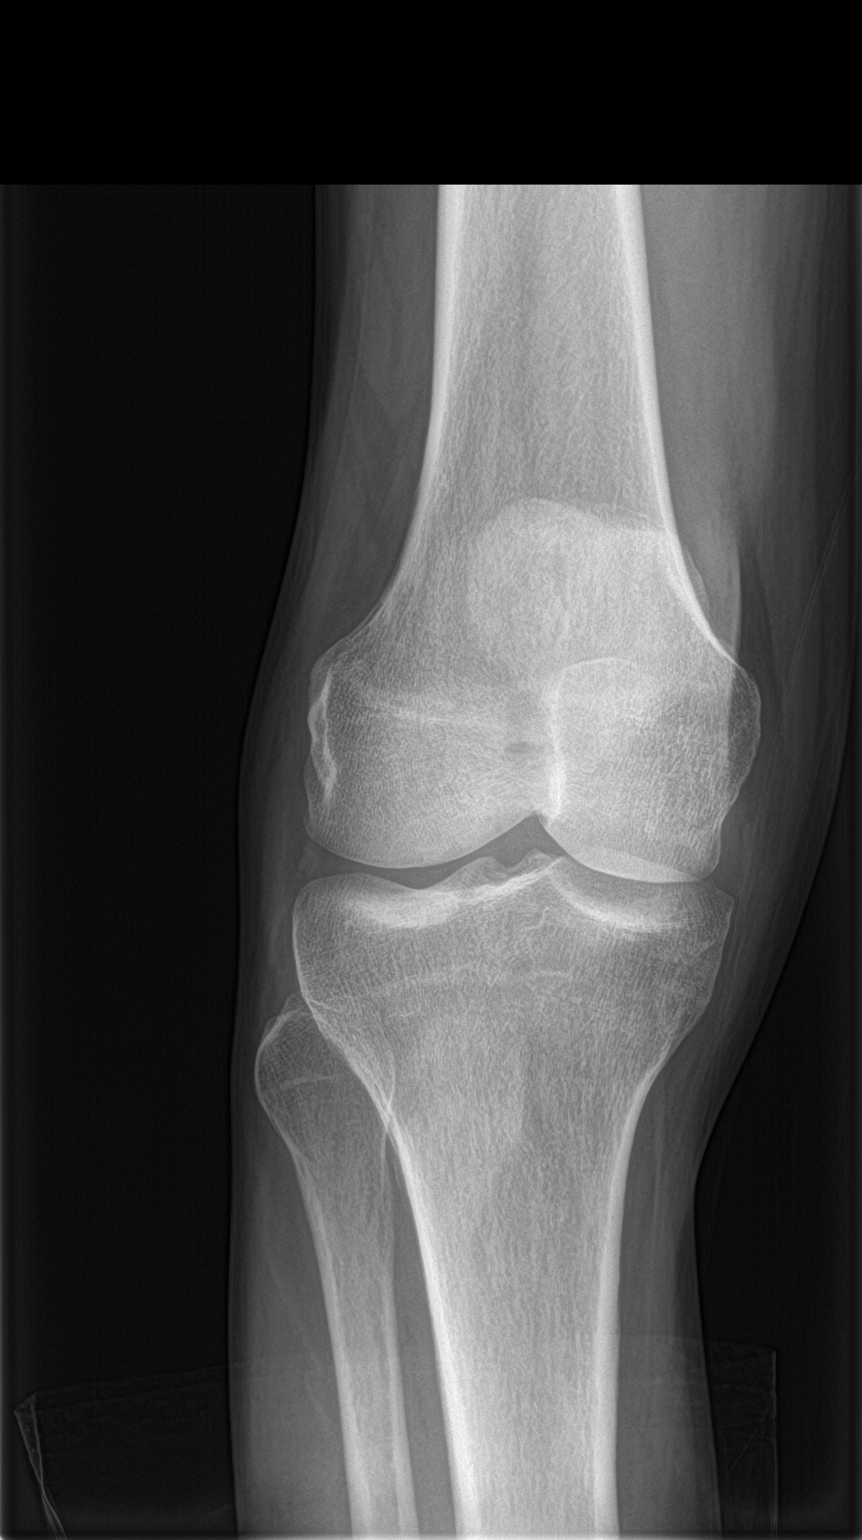

[knee lat]
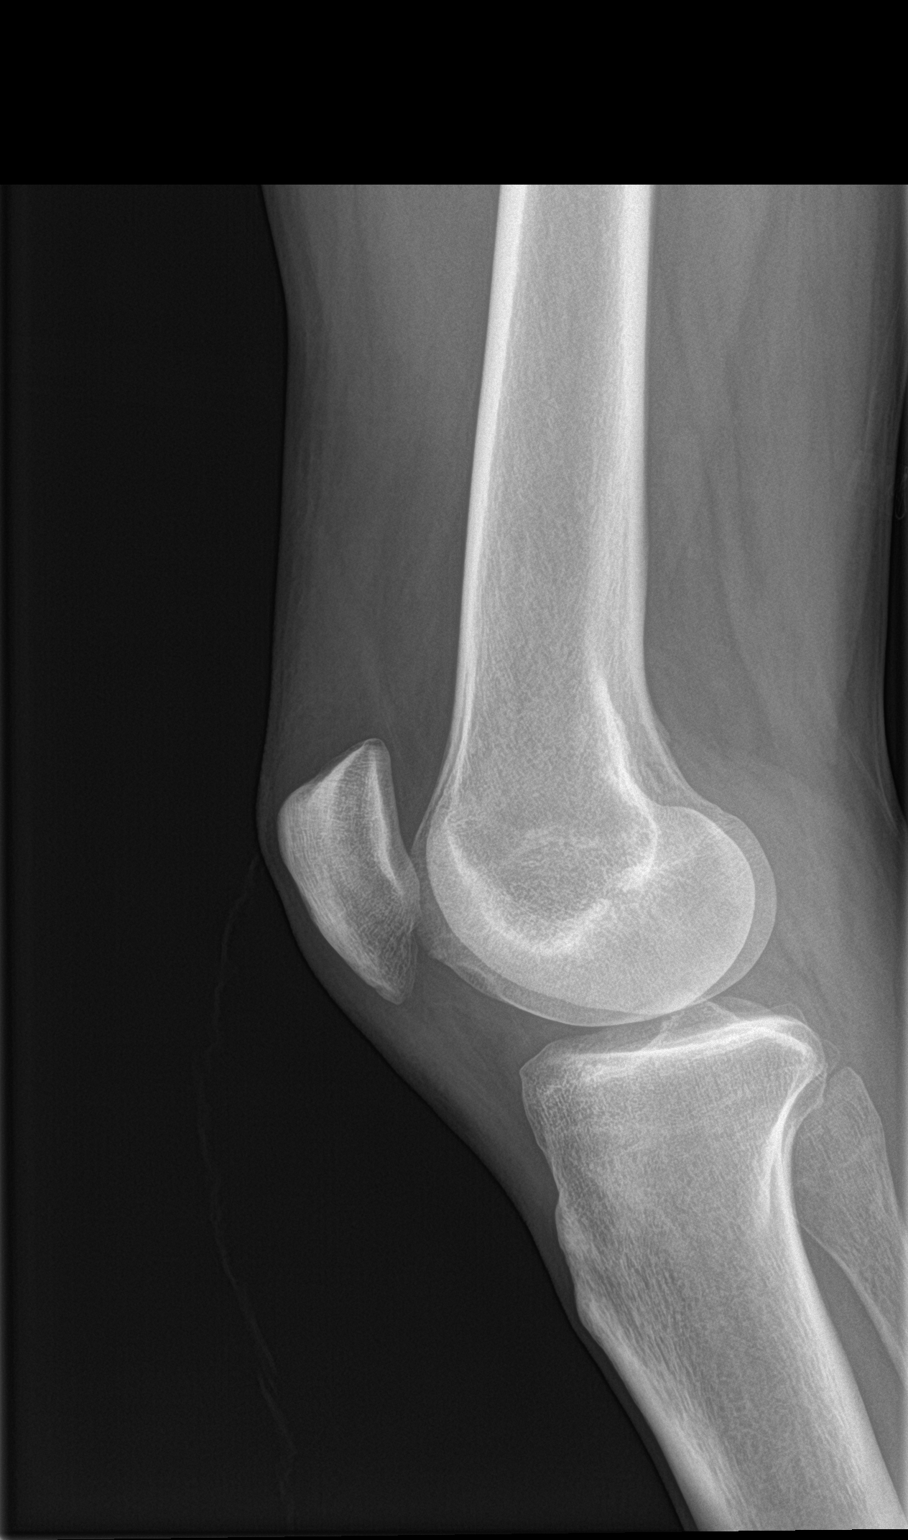

[2 of 2 positions shown; findings below may reference images not displayed]

FINDINGS: No evidence of fracture, dislocation, or joint effusion. No evidence
of arthropathy or other focal bone abnormality. Soft tissues are
unremarkable.
IMPRESSION: Negative.
